# Patient Record
Sex: Female | Born: 1955 | Race: White | Hispanic: No | Marital: Married | Smoking: Never smoker
Health system: Southern US, Academic
[De-identification: ages and names within clinical notes are randomized; demographics above are authoritative.]

## PROBLEM LIST (undated history)

## (undated) DIAGNOSIS — I639 Cerebral infarction, unspecified: Secondary | ICD-10-CM

## (undated) HISTORY — PX: NO PAST SURGERIES: SHX2092

---

## 2007-11-28 ENCOUNTER — Emergency Department: Payer: Self-pay | Admitting: Emergency Medicine

## 2007-12-01 ENCOUNTER — Ambulatory Visit: Payer: Self-pay | Admitting: Emergency Medicine

## 2010-02-02 ENCOUNTER — Ambulatory Visit: Payer: Self-pay | Admitting: Internal Medicine

## 2010-07-16 ENCOUNTER — Emergency Department: Payer: Self-pay | Admitting: Emergency Medicine

## 2010-07-31 ENCOUNTER — Ambulatory Visit: Payer: Self-pay | Admitting: Physician Assistant

## 2013-02-26 ENCOUNTER — Emergency Department: Payer: Self-pay | Admitting: Emergency Medicine

## 2013-02-26 LAB — CBC
HCT: 37.8 % (ref 35.0–47.0)
HGB: 12.7 g/dL (ref 12.0–16.0)
MCH: 30.7 pg (ref 26.0–34.0)
MCHC: 33.6 g/dL (ref 32.0–36.0)
MCV: 91 fL (ref 80–100)
Platelet: 217 10*3/uL (ref 150–440)
RBC: 4.13 10*6/uL (ref 3.80–5.20)
RDW: 13.3 % (ref 11.5–14.5)
WBC: 5.2 10*3/uL (ref 3.6–11.0)

## 2013-02-26 LAB — COMPREHENSIVE METABOLIC PANEL
ANION GAP: 6 — AB (ref 7–16)
AST: 31 U/L (ref 15–37)
Albumin: 3.3 g/dL — ABNORMAL LOW (ref 3.4–5.0)
Alkaline Phosphatase: 53 U/L
BUN: 14 mg/dL (ref 7–18)
Bilirubin,Total: 0.4 mg/dL (ref 0.2–1.0)
CHLORIDE: 108 mmol/L — AB (ref 98–107)
CO2: 24 mmol/L (ref 21–32)
CREATININE: 0.63 mg/dL (ref 0.60–1.30)
Calcium, Total: 8.3 mg/dL — ABNORMAL LOW (ref 8.5–10.1)
EGFR (African American): >60
GLUCOSE: 84 mg/dL (ref 65–99)
OSMOLALITY: 275 (ref 275–301)
POTASSIUM: 3.9 mmol/L (ref 3.5–5.1)
SGPT (ALT): 24 U/L (ref 12–78)
Sodium: 138 mmol/L (ref 136–145)
Total Protein: 6.6 g/dL (ref 6.4–8.2)

## 2013-02-26 LAB — TROPONIN I

## 2013-08-03 ENCOUNTER — Ambulatory Visit: Payer: Self-pay | Admitting: Physician Assistant

## 2013-09-03 ENCOUNTER — Ambulatory Visit: Payer: Self-pay | Admitting: Physician Assistant

## 2014-01-25 ENCOUNTER — Ambulatory Visit: Payer: Self-pay | Admitting: Unknown Physician Specialty

## 2014-05-26 ENCOUNTER — Encounter: Payer: Self-pay | Admitting: Family Medicine

## 2014-05-26 DIAGNOSIS — R490 Dysphonia: Secondary | ICD-10-CM | POA: Insufficient documentation

## 2014-05-26 DIAGNOSIS — M19079 Primary osteoarthritis, unspecified ankle and foot: Secondary | ICD-10-CM | POA: Insufficient documentation

## 2014-05-26 DIAGNOSIS — M778 Other enthesopathies, not elsewhere classified: Secondary | ICD-10-CM | POA: Insufficient documentation

## 2014-05-26 DIAGNOSIS — Z8673 Personal history of transient ischemic attack (TIA), and cerebral infarction without residual deficits: Secondary | ICD-10-CM | POA: Insufficient documentation

## 2014-06-17 ENCOUNTER — Ambulatory Visit
Admission: EM | Admit: 2014-06-17 | Discharge: 2014-06-17 | Disposition: A | Discharge status: Home / Self Care | Payer: Federal, State, Local not specified - PPO | Attending: Registered Nurse | Admitting: Registered Nurse

## 2014-06-17 ENCOUNTER — Encounter: Payer: Self-pay | Admitting: Emergency Medicine

## 2014-06-17 DIAGNOSIS — J069 Acute upper respiratory infection, unspecified: Secondary | ICD-10-CM | POA: Diagnosis not present

## 2014-06-17 DIAGNOSIS — S29012A Strain of muscle and tendon of back wall of thorax, initial encounter: Secondary | ICD-10-CM | POA: Diagnosis not present

## 2014-06-17 HISTORY — DX: Cerebral infarction, unspecified: I63.9

## 2014-06-17 MED ORDER — IBUPROFEN 800 MG PO TABS: 800.0000 mg | ORAL_TABLET | Freq: Three times a day (TID) | ORAL | Status: DC | PRN

## 2014-06-17 MED ORDER — PSEUDOEPHEDRINE HCL 30 MG PO TABS: 30.0000 mg | ORAL_TABLET | ORAL | Status: DC | PRN

## 2014-06-17 MED ORDER — CYCLOBENZAPRINE HCL 5 MG PO TABS: 5.0000 mg | ORAL_TABLET | Freq: Three times a day (TID) | ORAL | Status: DC | PRN

## 2014-06-17 NOTE — ED Provider Notes (Signed)
CSN: [id number]     Arrival date & time 06/17/14  1126 History   None    Chief Complaint  Patient presents with  . Back Pain  . 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  Has noticed congestion, nonproductive cough, forehead headache, sore throat, menopause hot flashes continue, stomach feels tight  Patient is a 59 y.o. female presenting with back pain and abdominal pain. The history is provided by the patient.  Back Pain Location:  Thoracic spine Quality:  Aching Radiates to:  Does not radiate Pain severity:  Moderate Pain is:  Worse during the night Onset quality:  Gradual Duration:  1 week Timing:  Constant Progression:  Unchanged Chronicity:  New Context: lifting heavy objects and recent illness   Context: not emotional stress, not falling, not jumping from heights, not MCA, not MVA, not occupational injury, not pedestrian accident, not physical stress, not recent injury and not twisting   Relieved by:  Heating pad Worsened by:  Coughing and lying down Ineffective treatments:  Lying down and ibuprofen Associated symptoms: abdominal pain and headaches   Associated symptoms: no abdominal swelling, no bladder incontinence, no bowel incontinence, no chest pain, no dysuria, no fever, no leg pain, no numbness, no paresthesias, no tingling and no weakness   Abdominal Pain Associated symptoms: cough and sore throat   Associated symptoms: no chest pain, no chills, no constipation, no diarrhea, no dysuria, no fatigue, no fever, no nausea and no shortness of breath     Past Medical History  Diagnosis Date  . Stroke     3 years ago (mini stroke)   History reviewed. No pertinent  past surgical history. Family History  Problem Relation Age of Onset  . Hyperlipidemia Mother   . Heart attack Father    History  Substance Use Topics  . Smoking status: Former Games developer  . Smokeless tobacco: Never Used  . Alcohol Use: No   OB History    No data available     Review of Systems  Constitutional: Negative for fever, chills, diaphoresis, activity change, appetite change and fatigue.  HENT: Positive for congestion, rhinorrhea and sore throat. Negative for dental problem, drooling, ear discharge, ear pain, facial swelling, hearing loss, mouth sores, nosebleeds, postnasal drip, sinus pressure, sneezing, tinnitus, trouble swallowing and voice change.   Eyes: Negative for photophobia, pain, discharge, redness and itching.  Respiratory: Positive for cough. Negative for shortness of breath, wheezing and stridor.   Cardiovascular: Negative for chest pain.  Gastrointestinal: Positive for abdominal pain. Negative for nausea, diarrhea, constipation, blood in stool and bowel incontinence.  Genitourinary: Negative for bladder incontinence, dysuria, difficulty urinating and menstrual problem.  Musculoskeletal: Positive for myalgias and back pain. Negative for joint swelling, arthralgias, gait problem, neck pain and neck stiffness.  Skin: Negative for color change, pallor, rash and wound.  Allergic/Immunologic: Negative for environmental allergies and food allergies.  Neurological: Positive for headaches. Negative for dizziness, tingling, tremors, syncope, weakness, numbness and paresthesias.  Hematological: Negative for adenopathy. Does not bruise/bleed easily.  Psychiatric/Behavioral: Positive for sleep disturbance.    Allergies  Review of patient's allergies indicates no known allergies.  Home Medications   Prior to Admission medications   Medication Sig Start Date End Date Taking? Authorizing Provider  aspirin 325 MG EC tablet Take 1 tablet by mouth daily.    Historical  Provider, MD  cyclobenzaprine (FLEXERIL) 5 MG tablet Take 1 tablet (5 mg total) by mouth 3 (three) times daily as needed for muscle spasms. 06/17/14   Barbaraann Barthelina A Cedricka Sackrider, NP  ibuprofen (ADVIL,MOTRIN) 800 MG tablet Take 1 tablet (800 mg total) by mouth every 8 (eight) hours as needed for moderate pain. 06/17/14   Barbaraann Barthelina A Azelea Seguin, NP  pseudoephedrine (SUDAFED) 30 MG tablet Take 1 tablet (30 mg total) by mouth every 4 (four) hours as needed for congestion. 06/17/14   Jarold Songina A Ryan Palermo, NP   BP 123/82 mmHg  Pulse 71  Temp(Src) 98.3 F (36.8 C) (Tympanic)  Resp 16  Ht 5\' 4"  (1.626 m)  Wt 134 lb (60.782 kg)  BMI 22.99 kg/m2  SpO2 98% Physical Exam  Constitutional: She is oriented to person, place, and time. Vital signs are normal. She appears well-developed and well-nourished.  HENT:  Head: Normocephalic and atraumatic.  Right Ear: External ear normal.  Left Ear: External ear normal.  Nose: Nose normal.  Mouth/Throat: Oropharynx is clear and moist. No oropharyngeal exudate.  Erythema posterior pharynx; bilateral TMs with air fluid level clear  Eyes: Conjunctivae, EOM and lids are normal. Pupils are equal, round, and reactive to light. Right eye exhibits no discharge. Left eye exhibits no discharge. No scleral icterus.  Neck: Trachea normal and normal range of motion. Neck supple. No JVD present. No tracheal deviation present. No thyromegaly present.  Cardiovascular: Normal rate, regular rhythm, normal heart sounds and intact distal pulses.  Exam reveals no gallop and no friction rub.   No murmur heard. Pulmonary/Chest: Effort normal and breath sounds normal. No respiratory distress. She has no wheezes. She has no rales. She exhibits no tenderness.  Abdominal: Soft. She exhibits no distension. There is no tenderness.  Musculoskeletal: Normal range of motion. She exhibits no edema.       Thoracic back: She exhibits tenderness and pain. She exhibits normal range of motion, no bony tenderness, no  swelling, no edema, no deformity, no laceration, no spasm and normal pulse.       Back:  Full AROM shoulder, neck, back does not worsen pain  Lymphadenopathy:    She has no cervical adenopathy.  Neurological: She is alert and oriented to person, place, and time.  Skin: Skin is warm, dry and intact. No rash noted. No erythema. No pallor.  Psychiatric: She has a normal mood and affect. Her speech is normal and behavior is normal. Judgment and thought content normal. Cognition and memory are normal.  Nursing note and vitals reviewed.   ED Course  Procedures (including critical care time) Labs Review Labs Reviewed - No data to display  Imaging Review No results found. For acute pain, rest, and intermittent application of heat (do not sleep on heating pad).  I discussed longer-term treatment plan of PRN PO NSAIDS and I discussed a home back care exercise program with a strengthening and flexibility exercise.  Patient given Exitcare handout on upper back strain.  Patient reported she does not need to perform heavy lifting at work and will have coworker assist did not want restrictions letter today.  Trial flexeril 5mg  po TID prn discussed no alcohol or driving x8 hours after taking first dose until effects known for her as last took over 20 years ago.  Proper avoidance of heavy lifting discussed.  Consider physical therapy or chiropractic care and radiology if not improving.  Call or return to clinic as needed if these symptoms worsen or fail to improve as anticipated especially leg weakness, loss of bowel/bladder control or saddle paresthesias.   Viral upper respiratory infection: no evidence of invasive bacterial infection, non toxic and well hydrated.  This is most likely self limiting viral infection.  I do not see where any further testing or imaging is necessary at this time.   I will suggest supportive care, rest, good hygiene and encourage the patient to take adequate fluids.  The patient is to  return to clinic or EMERGENCY ROOM if symptoms worsen or change significantly e.g. fever, lethargy, SOB, wheezing.  Exitcare handout on common cold given to patient.  Patient verbalized agreement and understanding of treatment plan.   P2:  Hand washing and cover cough, injury prevention, fitness   MDM   1. Upper back strain, initial encounter   2. URI, acute        Barbaraann Barthelina A Tailor Lucking, NP 06/17/14 1227

## 2014-06-17 NOTE — ED Notes (Signed)
Patient c/o upper back pain and stomach discomfort for about a week.  Patient denies N/V/D.  Patient states that her pain is improved after eating.  Patient denies fevers.

## 2014-06-17 NOTE — Discharge Instructions (Signed)
Back Exercises These exercises may help you when beginning to rehabilitate your injury. Your symptoms may resolve with or without further involvement from your physician, physical therapist or athletic trainer. While completing these exercises, remember:   Restoring tissue flexibility helps normal motion to return to the joints. This allows healthier, less painful movement and activity.  An effective stretch should be held for at least 30 seconds.  A stretch should never be painful. You should only feel a gentle lengthening or release in the stretched tissue. STRETCH - Extension, Prone on Elbows   Lie on your stomach on the floor, a bed will be too soft. Place your palms about shoulder width apart and at the height of your head.  Place your elbows under your shoulders. If this is too painful, stack pillows under your chest.  Allow your body to relax so that your hips drop lower and make contact more completely with the floor.  Hold this position for __________ seconds.  Slowly return to lying flat on the floor. Repeat __________ times. Complete this exercise __________ times per day.  RANGE OF MOTION - Extension, Prone Press Ups   Lie on your stomach on the floor, a bed will be too soft. Place your palms about shoulder width apart and at the height of your head.  Keeping your back as relaxed as possible, slowly straighten your elbows while keeping your hips on the floor. You may adjust the placement of your hands to maximize your comfort. As you gain motion, your hands will come more underneath your shoulders.  Hold this position __________ seconds.  Slowly return to lying flat on the floor. Repeat __________ times. Complete this exercise __________ times per day.  RANGE OF MOTION- Quadruped, Neutral Spine   Assume a hands and knees position on a firm surface. Keep your hands under your shoulders and your knees under your hips. You may place padding under your knees for  comfort.  Drop your head and point your tail bone toward the ground below you. This will round out your low back like an angry cat. Hold this position for __________ seconds.  Slowly lift your head and release your tail bone so that your back sags into a large arch, like an old horse.  Hold this position for __________ seconds.  Repeat this until you feel limber in your low back.  Now, find your "sweet spot." This will be the most comfortable position somewhere between the two previous positions. This is your neutral spine. Once you have found this position, tense your stomach muscles to support your low back.  Hold this position for __________ seconds. Repeat __________ times. Complete this exercise __________ times per day.  STRETCH - Flexion, Single Knee to Chest   Lie on a firm bed or floor with both legs extended in front of you.  Keeping one leg in contact with the floor, bring your opposite knee to your chest. Hold your leg in place by either grabbing behind your thigh or at your knee.  Pull until you feel a gentle stretch in your low back. Hold __________ seconds.  Slowly release your grasp and repeat the exercise with the opposite side. Repeat __________ times. Complete this exercise __________ times per day.  STRETCH - Hamstrings, Standing  Stand or sit and extend your right / left leg, placing your foot on a chair or foot stool  Keeping a slight arch in your low back and your hips straight forward.  Lead with your chest and  lean forward at the waist until you feel a gentle stretch in the back of your right / left knee or thigh. (When done correctly, this exercise requires leaning only a small distance.)  Hold this position for __________ seconds. Repeat __________ times. Complete this stretch __________ times per day. STRENGTHENING - Deep Abdominals, Pelvic Tilt   Lie on a firm bed or floor. Keeping your legs in front of you, bend your knees so they are both pointed  toward the ceiling and your feet are flat on the floor.  Tense your lower abdominal muscles to press your low back into the floor. This motion will rotate your pelvis so that your tail bone is scooping upwards rather than pointing at your feet or into the floor.  With a gentle tension and even breathing, hold this position for __________ seconds. Repeat __________ times. Complete this exercise __________ times per day.  STRENGTHENING - Abdominals, Crunches   Lie on a firm bed or floor. Keeping your legs in front of you, bend your knees so they are both pointed toward the ceiling and your feet are flat on the floor. Cross your arms over your chest.  Slightly tip your chin down without bending your neck.  Tense your abdominals and slowly lift your trunk high enough to just clear your shoulder blades. Lifting higher can put excessive stress on the low back and does not further strengthen your abdominal muscles.  Control your return to the starting position. Repeat __________ times. Complete this exercise __________ times per day.  STRENGTHENING - Quadruped, Opposite UE/LE Lift   Assume a hands and knees position on a firm surface. Keep your hands under your shoulders and your knees under your hips. You may place padding under your knees for comfort.  Find your neutral spine and gently tense your abdominal muscles so that you can maintain this position. Your shoulders and hips should form a rectangle that is parallel with the floor and is not twisted.  Keeping your trunk steady, lift your right hand no higher than your shoulder and then your left leg no higher than your hip. Make sure you are not holding your breath. Hold this position __________ seconds.  Continuing to keep your abdominal muscles tense and your back steady, slowly return to your starting position. Repeat with the opposite arm and leg. Repeat __________ times. Complete this exercise __________ times per day. Document Released:  02/08/2005 Document Revised: 04/15/2011 Document Reviewed: 05/05/2008 [facility] Patient Information 2015 [place], [place]. This information is not intended to replace advice given to you by your health care provider. Make sure you discuss any questions you have with your health care provider. Upper Respiratory Infection, Adult An upper respiratory infection (URI) is also sometimes known as the common cold. The upper respiratory tract includes the nose, sinuses, throat, trachea, and bronchi. Bronchi are the airways leading to the lungs. Most people improve within 1 week, but symptoms can last up to 2 weeks. A residual cough may last even longer.  CAUSES Many different viruses can infect the tissues lining the upper respiratory tract. The tissues become irritated and inflamed and often become very moist. Mucus production is also common. A cold is contagious. You can easily spread the virus to others by oral contact. This includes kissing, sharing a glass, coughing, or sneezing. Touching your mouth or nose and then touching a surface, which is then touched by another person, can also spread the virus. SYMPTOMS  Symptoms typically develop 1 to 3 days after  you come in contact with a cold virus. Symptoms vary from person to person. They may include:  Runny nose.  Sneezing.  Nasal congestion.  Sinus irritation.  Sore throat.  Loss of voice (laryngitis).  Cough.  Fatigue.  Muscle aches.  Loss of appetite.  Headache.  Low-grade fever. DIAGNOSIS  You might diagnose your own cold based on familiar symptoms, since most people get a cold 2 to 3 times a year. Your caregiver can confirm this based on your exam. Most importantly, your caregiver can check that your symptoms are not due to another disease such as strep throat, sinusitis, pneumonia, asthma, or epiglottitis. Blood tests, throat tests, and X-rays are not necessary to diagnose a common cold, but they may sometimes be helpful in excluding  other more serious diseases. Your caregiver will decide if any further tests are required. RISKS AND COMPLICATIONS  You may be at risk for a more severe case of the common cold if you smoke cigarettes, have chronic heart disease (such as heart failure) or lung disease (such as asthma), or if you have a weakened immune system. The very young and very old are also at risk for more serious infections. Bacterial sinusitis, middle ear infections, and bacterial pneumonia can complicate the common cold. The common cold can worsen asthma and chronic obstructive pulmonary disease (COPD). Sometimes, these complications can require emergency medical care and may be life-threatening. PREVENTION  The best way to protect against getting a cold is to practice good hygiene. Avoid oral or hand contact with people with cold symptoms. Wash your hands often if contact occurs. There is no clear evidence that vitamin C, vitamin E, echinacea, or exercise reduces the chance of developing a cold. However, it is always recommended to get plenty of rest and practice good nutrition. TREATMENT  Treatment is directed at relieving symptoms. There is no cure. Antibiotics are not effective, because the infection is caused by a virus, not by bacteria. Treatment may include:  Increased fluid intake. Sports drinks offer valuable electrolytes, sugars, and fluids.  Breathing heated mist or steam (vaporizer or shower).  Eating chicken soup or other clear broths, and maintaining good nutrition.  Getting plenty of rest.  Using gargles or lozenges for comfort.  Controlling fevers with ibuprofen or acetaminophen as directed by your caregiver.  Increasing usage of your inhaler if you have asthma. Zinc gel and zinc lozenges, taken in the first 24 hours of the common cold, can shorten the duration and lessen the severity of symptoms. Pain medicines may help with fever, muscle aches, and throat pain. A variety of non-prescription medicines  are available to treat congestion and runny nose. Your caregiver can make recommendations and may suggest nasal or lung inhalers for other symptoms.  HOME CARE INSTRUCTIONS   Only take over-the-counter or prescription medicines for pain, discomfort, or fever as directed by your caregiver.  Use a warm mist humidifier or inhale steam from a shower to increase air moisture. This may keep secretions moist and make it easier to breathe.  Drink enough water and fluids to keep your urine clear or pale yellow.  Rest as needed.  Return to work when your temperature has returned to normal or as your caregiver advises. You may need to stay home longer to avoid infecting others. You can also use a face mask and careful hand washing to prevent spread of the virus. SEEK MEDICAL CARE IF:   After the first few days, you feel you are getting worse rather than  better.  You need your caregiver's advice about medicines to control symptoms.  You develop chills, worsening shortness of breath, or brown or red sputum. These may be signs of pneumonia.  You develop yellow or brown nasal discharge or pain in the face, especially when you bend forward. These may be signs of sinusitis.  You develop a fever, swollen neck glands, pain with swallowing, or white areas in the back of your throat. These may be signs of strep throat. SEEK IMMEDIATE MEDICAL CARE IF:   You have a fever.  You develop severe or persistent headache, ear pain, sinus pain, or chest pain.  You develop wheezing, a prolonged cough, cough up blood, or have a change in your usual mucus (if you have chronic lung disease).  You develop sore muscles or a stiff neck. Document Released: 07/17/2000 Document Revised: 04/15/2011 Document Reviewed: 04/28/2013 [facility] Patient Information 2015 [place]. This information is not intended to replace advice given to you by your health care provider. Make sure you discuss any questions you have with your  health care provider. Mid-Back Strain with Rehab  A strain is an injury in which a tendon or muscle is torn. The muscles and tendons of the mid-back are vulnerable to strains. However, these muscles and tendons are very strong and require a great force to be injured. The muscles of the mid-back are responsible for stabilizing the spinal column, as well as spinal twisting (rotation). Strains are classified into three categories. Grade 1 strains cause pain, but the tendon is not lengthened. Grade 2 strains include a lengthened ligament, due to the ligament being stretched or partially ruptured. With grade 2 strains there is still function, although the function may be decreased. Grade 3 strains involve a complete tear of the tendon or muscle, and function is usually impaired. SYMPTOMS   Pain in the middle of the back.  Pain that may affect only one side, and is worse with movement.  Muscle spasms, and often swelling in the back.  Loss of strength of the back muscles.  Crackling sound (crepitation) when the muscles are touched. CAUSES  Mid-back strains occur when a force is placed on the muscles or tendons that is greater than they can handle. Common causes of injury include:  Ongoing overuse of the muscle-tendon units in the middle back, usually from incorrect body posture.  A single violent injury or force applied to the back. RISK INCREASES WITH:  Sports that involve twisting forces on the spine or a lot of bending at the waist (football, rugby, weightlifting, bowling, golf, tennis, speed skating, racquetball, swimming, running, gymnastics, diving).  Poor strength and flexibility.  Failure to warm up properly before activity.  Family history of low back pain or disk disorders.  Previous back injury or surgery (especially fusion). PREVENTION  Learn and use proper sports technique.  Warm up and stretch properly before activity.  Allow for adequate recovery between  workouts.  Maintain physical fitness:  Strength, flexibility, and endurance.  Cardiovascular fitness. PROGNOSIS  If treated properly, mid-back strains usually heal within 6 weeks. RELATED COMPLICATIONS   Frequently recurring symptoms, resulting in a chronic problem. Properly treating the problem the first time decreases frequency of recurrence.  Chronic inflammation, scarring, and partial muscle-tendon tear.  Delayed healing or resolution of symptoms, especially if activity is resumed too soon.  Prolonged disability. TREATMENT Treatment first involves the use of ice and medicine, to reduce pain and inflammation. As the pain begins to subside, you may begin strengthening  and stretching exercises to improve body posture and sport technique. These exercises may be performed at home or with a therapist. Severe injuries may require referral to a therapist for further evaluation and treatment, such as ultrasound. Corticosteroid injections may be given to help reduce inflammation. Biofeedback (watching monitors of your body processes) and psychotherapy may also be prescribed. Prolonged bed rest is felt to do more harm than good. Massage may help break the muscle spasms. Sometimes, an injection of cortisone, with or without local anesthetics, may be given to help relieve the pain and spasms. MEDICATION   If pain medicine is needed, nonsteroidal anti-inflammatory medicines (aspirin and ibuprofen), or other minor pain relievers (acetaminophen), are often advised.  Do not take pain medicine for 7 days before surgery.  Prescription pain relievers may be given, if your caregiver thinks they are needed. Use only as directed and only as much as you need.  Ointments applied to the skin may be helpful.  Corticosteroid injections may be given by your caregiver. These injections should be reserved for the most serious cases, because they may only be given a certain number of times. HEAT AND COLD:    Cold treatment (icing) should be applied for 10 to 15 minutes every 2 to 3 hours for inflammation and pain, and immediately after activity that aggravates your symptoms. Use ice packs or an ice massage.  Heat treatment may be used before performing stretching and strengthening activities prescribed by your caregiver, physical therapist, or athletic trainer. Use a heat pack or a warm water soak. SEEK IMMEDIATE MEDICAL CARE IF:  Symptoms get worse or do not improve in 2 to 4 weeks, despite treatment.  You develop numbness, weakness, or loss of bowel or bladder function.  New, unexplained symptoms develop. (Drugs used in treatment may produce side effects.) EXERCISES RANGE OF MOTION (ROM) AND STRETCHING EXERCISES - Mid-Back Strain These exercises may help you when beginning to rehabilitate your injury. In order to successfully resolve your symptoms, you must improve your posture. These exercises are designed to help reduce the forward-head and rounded-shoulder posture which contributes to this condition. Your symptoms may resolve with or without further involvement from your physician, physical therapist or athletic trainer. While completing these exercises, remember:   Restoring tissue flexibility helps normal motion to return to the joints. This allows healthier, less painful movement and activity.  An effective stretch should be held for at least 30 seconds.  A stretch should never be painful. You should only feel a gentle lengthening or release in the stretched tissue. STRETCH - Axial Extension  Stand or sit on a firm surface. Assume a good posture: chest up, shoulders drawn back, stomach muscles slightly tense, knees unlocked (if standing) and feet hip width apart.  Slowly retract your chin, so your head slides back and your chin slightly lowers. Continue to look straight ahead.  You should feel a gentle stretch in the back of your head. Be certain not to feel an aggressive stretch  since this can cause headaches later.  Hold for __________ seconds. Repeat __________ times. Complete this exercise __________ times per day. RANGE OF MOTION- Upper Thoracic Extension  Sit on a firm chair with a high back. Assume a good posture: chest up, shoulders drawn back, abdominal muscles slightly tense, and feet hip width apart. Place a small pillow or folded towel in the curve of your lower back, if you are having difficulty maintaining good posture.  Gently brace your neck with your hands, allowing your  arms to rest on your chest.  Continue to support your neck and slowly extend your back over the chair. You will feel a stretch across your upper back.  Hold __________ seconds. Slowly return to the starting position. Repeat __________ times. Complete this exercise __________ times per day. RANGE OF MOTION- Mid-Thoracic Extension  Roll a towel so that it is about 4 inches in diameter.  Position the towel lengthwise. Lay on the towel so that your spine, but not your shoulder blades, are supported.  You should feel your mid-back arching toward the floor. To increase the stretch, extend your arms away from your body.  Hold for __________ seconds. Repeat exercise __________ times, __________ times per day. STRENGTHENING EXERCISES - Mid-Back Strain These exercises may help you when beginning to rehabilitate your injury. They may resolve your symptoms with or without further involvement from your physician, physical therapist or athletic trainer. While completing these exercises, remember:   Muscles can gain both the endurance and the strength needed for everyday activities through controlled exercises.  Complete these exercises as instructed by your physician, physical therapist or athletic trainer. Increase the resistance and repetitions only as guided by your caregiver.  You may experience muscle soreness or fatigue, but the pain or discomfort you are trying to eliminate should  never worsen during these exercises. If this pain does worsen, stop and make certain you are following the directions exactly. If the pain is still present after adjustments, discontinue the exercise until you can discuss the trouble with your caregiver. STRENGTHENING - Quadruped, Opposite UE/LE Lift  Assume a hands and knees position on a firm surface. Keep your hands under your shoulders and your knees under your hips. You may place padding under your knees for comfort.  Find your neutral spine and gently tense your abdominal muscles so that you can maintain this position. Your shoulders and hips should form a rectangle that is parallel with the floor and is not twisted.  Keeping your trunk steady, lift your right hand no higher than your shoulder and then your left leg no higher than your hip. Make sure you are not holding your breath. Hold this position __________ seconds.  Continuing to keep your abdominal muscles tense and your back steady, slowly return to your starting position. Repeat with the opposite arm and leg. Repeat __________ times. Complete this exercise __________ times per day.  STRENGTH - Shoulder Extensors  Secure a rubber exercise band or tubing to a fixed object (table, pole) so that it is at the height of your shoulders when you are either standing, or sitting on a firm armless chair.  With a thumbs-up grip, grasp an end of the band in each hand. Straighten your elbows and lift your hands straight in front of you at shoulder height. Step back away from the secured end of band, until it becomes tense.  Squeezing your shoulder blades together, pull your hands down to the sides of your thighs. Do not allow your hands to go behind you.  Hold for __________ seconds. Slowly ease the tension on the band, as you reverse the directions and return to the starting position. Repeat __________ times. Complete this exercise __________ times per day.  STRENGTH - Horizontal  Abductors Choose one of the two positions to complete this exercise. Prone: lying on stomach:  Lie on your stomach on a firm surface so that your right / left arm overhangs the edge. Rest your forehead on your opposite forearm. With your palm facing  the floor and your elbow straight, hold a __________ weight in your hand.  Squeeze your right / left shoulder blade to your mid-back spine and then slowly raise your arm to the height of the bed.  Hold for __________ seconds. Slowly reverse the directions and return to the starting position, controlling the weight as you lower your arm. Repeat __________ times. Complete this exercise __________ times per day. Standing:   Secure a rubber exercise band or tubing, so that it is at the height of your shoulders when you are either standing, or sitting on a firm armless chair.  Grasp an end of the band in each hand and have your palms face each other. Straighten your elbows and lift your hands straight in front of you at shoulder height. Step back away from the secured end of band, until it becomes tense.  Squeeze your shoulder blades together. Keeping your elbows locked and your hands at shoulder height, spread your arms apart, forming a "T" shape with your body. Hold __________ seconds. Slowly ease the tension on the band, as you reverse the directions and return to the starting position. Repeat __________ times. Complete this exercise __________ times per day. STRENGTH - Scapular Retractors and External Rotators, Rowing  Secure a rubber exercise band or tubing, so that it is at the height of your shoulders when you are either standing, or sitting on a firm armless chair.  With a palm-down grip, grasp an end of the band in each hand. Straighten your elbows and lift your hands straight in front of you at shoulder height. Step back away from the secured end of band, until it becomes tense.  Step 1: Squeeze your shoulder blades together. Bending your  elbows, draw your hands to your chest as if you are rowing a boat. At the end of this motion, your hands and elbow should be at shoulder height and your elbows should be out to your sides.  Step 2: Rotate your shoulder to raise your hands above your head. Your forearms should be vertical and your upper arms should be horizontal.  Hold for __________ seconds. Slowly ease the tension on the band, as you reverse the directions and return to the starting position. Repeat __________ times. Complete this exercise __________ times per day.  POSTURE AND BODY MECHANICS CONSIDERATIONS - Mid-Back Strain Keeping correct posture when sitting, standing or completing your activities will reduce the stress put on different body tissues, allowing injured tissues a chance to heal and limiting painful experiences. The following are general guidelines for improved posture. Your physician or physical therapist will provide you with any instructions specific to your needs. While reading these guidelines, remember:  The exercises prescribed by your provider will help you have the flexibility and strength to maintain correct postures.  The correct posture provides the best environment for your joints to work. All of your joints have less wear and tear when properly supported by a spine with good posture. This means you will experience a healthier, less painful body.  Correct posture must be practiced with all of your activities, especially prolonged sitting and standing. Correct posture is as important when doing repetitive low-stress activities (typing) as it is when doing a single heavy-load activity (lifting). PROPER SITTING POSTURE In order to minimize stress and discomfort on your spine, you must sit with correct posture. Sitting with good posture should be effortless for a healthy body. Returning to good posture is a gradual process. Many people can work toward this  most comfortably by using various supports until they  have the flexibility and strength to maintain this posture on their own. When sitting with proper posture, your ears will fall over your shoulders and your shoulders will fall over your hips. You should use the back of the chair to support your upper back. Your lower back will be in a neutral position, just slightly arched. You may place a small pillow or folded towel at the base of your low back for  support.  When working at a desk, create an environment that supports good, upright posture. Without extra support, muscles fatigue and lead to excessive strain on joints and other tissues. Keep these recommendations in mind: CHAIR:  A chair should be able to slide under your desk when your back makes contact with the back of the chair. This allows you to work closely.  The chair's height should allow your eyes to be level with the upper part of your monitor and your hands to be slightly lower than your elbows. BODY POSITION  Your feet should make contact with the floor. If this is not possible, use a foot rest.  Keep your ears over your shoulders. This will reduce stress on your neck and lower back. INCORRECT SITTING POSTURES If you are feeling tired and unable to assume a healthy sitting posture, do not slouch or slump. This puts excessive strain on your back tissues, causing more damage and pain. Healthier options include:  Using more support, like a lumbar pillow.  Switching tasks to something that requires you to be upright or walking.  Talking a brief walk.  Lying down to rest in a neutral-spine position. CORRECT STANDING POSTURES Proper standing posture should be assumed with all daily activities, even if they only take a few moments, like when brushing your teeth. As in sitting, your ears should fall over your shoulders and your shoulders should fall over your hips. You should keep a slight tension in your abdominal muscles to brace your spine. Your tailbone should point down to the  ground, not behind your body, resulting in an over-extended swayback posture.  INCORRECT STANDING POSTURES Common incorrect standing postures include a forward head, locked knees, and an excessive swayback. WALKING Walk with an upright posture. Your ears, shoulders and hips should all line-up. CORRECT LIFTING TECHNIQUES DO :   Assume a wide stance. This will provide you more stability and the opportunity to get as close as possible to the object which you are lifting.  Tense your abdominals to brace your spine. Bend at the knees and hips. Keeping your back locked in a neutral-spine position, lift using your leg muscles. Lift with your legs, keeping your back straight.  Test the weight of unknown objects before attempting to lift them.  Try to keep your elbows locked down at your sides in order get the best strength from your shoulders when carrying an object.  Always ask for help when lifting heavy or awkward objects. INCORRECT LIFTING TECHNIQUES DO NOT:   Lock your knees when lifting, even if it is a small object.  Bend and twist. Pivot at your feet or move your feet when needing to change directions.  Assume that you can safely pick up even a paperclip without proper posture. Document Released: 01/21/2005 Document Revised: 06/07/2013 Document Reviewed: 05/05/2008 [facility] Patient Information 2015 [place], [place]. This information is not intended to replace advice given to you by your health care provider. Make sure you discuss any questions you have with your  health care provider. ° °

## 2014-06-19 ENCOUNTER — Encounter: Payer: Self-pay | Admitting: *Deleted

## 2014-06-19 DIAGNOSIS — Z87891 Personal history of nicotine dependence: Secondary | ICD-10-CM | POA: Insufficient documentation

## 2014-06-19 DIAGNOSIS — B9789 Other viral agents as the cause of diseases classified elsewhere: Secondary | ICD-10-CM | POA: Diagnosis not present

## 2014-06-19 DIAGNOSIS — Z7982 Long term (current) use of aspirin: Secondary | ICD-10-CM | POA: Insufficient documentation

## 2014-06-19 DIAGNOSIS — J208 Acute bronchitis due to other specified organisms: Secondary | ICD-10-CM | POA: Diagnosis not present

## 2014-06-19 DIAGNOSIS — R0602 Shortness of breath: Secondary | ICD-10-CM | POA: Diagnosis present

## 2014-06-19 MED ORDER — ACETAMINOPHEN 325 MG PO TABS
650.0000 mg | ORAL_TABLET | Freq: Once | ORAL | Status: AC
  Administered 2014-06-19: 650 mg via ORAL

## 2014-06-19 MED ORDER — ONDANSETRON HCL 4 MG PO TABS: 8.0000 mg | ORAL_TABLET | Freq: Once | ORAL | Status: DC

## 2014-06-19 MED ORDER — ACETAMINOPHEN 325 MG PO TABS
ORAL_TABLET | ORAL | Status: AC
  Administered 2014-06-19: 650 mg via ORAL
  Filled 2014-06-19: qty 2

## 2014-06-19 MED ORDER — ONDANSETRON 8 MG PO TBDP
ORAL_TABLET | ORAL | Status: AC
  Administered 2014-06-19: 8 mg
  Filled 2014-06-19: qty 1

## 2014-06-19 NOTE — ED Notes (Signed)
Pt states cough and URI sxs since past Monday, worsening today. Pt is dyspneic @ rest, restless, persistent back pain, cough and chills. Pt is febrile and slightly tachycardiac in triage.

## 2014-06-20 ENCOUNTER — Encounter: Payer: Self-pay | Admitting: Emergency Medicine

## 2014-06-20 ENCOUNTER — Other Ambulatory Visit: Payer: Self-pay

## 2014-06-20 ENCOUNTER — Emergency Department: Payer: Federal, State, Local not specified - PPO

## 2014-06-20 ENCOUNTER — Emergency Department
Admission: EM | Admit: 2014-06-20 | Discharge: 2014-06-20 | Disposition: A | Discharge status: Home / Self Care | Payer: Federal, State, Local not specified - PPO | Attending: Student | Admitting: Student

## 2014-06-20 DIAGNOSIS — J208 Acute bronchitis due to other specified organisms: Secondary | ICD-10-CM

## 2014-06-20 LAB — COMPREHENSIVE METABOLIC PANEL
ALT: 39 U/L (ref 14–54)
ANION GAP: 7 (ref 5–15)
AST: 42 U/L — AB (ref 15–41)
Albumin: 4.8 g/dL (ref 3.5–5.0)
Alkaline Phosphatase: 64 U/L (ref 38–126)
BUN: 13 mg/dL (ref 6–20)
CALCIUM: 9.1 mg/dL (ref 8.9–10.3)
CO2: 29 mmol/L (ref 22–32)
Chloride: 102 mmol/L (ref 101–111)
Creatinine, Ser: 0.74 mg/dL (ref 0.44–1.00)
GFR calc Af Amer: >60 mL/min (ref >60)
Glucose, Bld: 118 mg/dL — ABNORMAL HIGH (ref 65–99)
Potassium: 4.3 mmol/L (ref 3.5–5.1)
Sodium: 138 mmol/L (ref 135–145)
Total Bilirubin: 0.5 mg/dL (ref 0.3–1.2)
Total Protein: 8.3 g/dL — ABNORMAL HIGH (ref 6.5–8.1)

## 2014-06-20 LAB — CBC
HCT: 44.7 % (ref 35.0–47.0)
HEMOGLOBIN: 14.9 g/dL (ref 12.0–16.0)
MCH: 30.3 pg (ref 26.0–34.0)
MCHC: 33.4 g/dL (ref 32.0–36.0)
MCV: 90.7 fL (ref 80.0–100.0)
PLATELETS: 165 10*3/uL (ref 150–440)
RBC: 4.93 MIL/uL (ref 3.80–5.20)
RDW: 13.8 % (ref 11.5–14.5)
WBC: 5.1 10*3/uL (ref 3.6–11.0)

## 2014-06-20 LAB — TROPONIN I: Troponin I: <0.03 ng/mL (ref <0.031)

## 2014-06-20 MED ORDER — PREDNISONE 10 MG PO TABS
ORAL_TABLET | ORAL | Status: AC
  Filled 2014-06-20: qty 3

## 2014-06-20 MED ORDER — ALBUTEROL SULFATE HFA 108 (90 BASE) MCG/ACT IN AERS: 2.0000 | INHALATION_SPRAY | Freq: Four times a day (QID) | RESPIRATORY_TRACT | Status: DC | PRN

## 2014-06-20 MED ORDER — SODIUM CHLORIDE 0.9 % IV BOLUS (SEPSIS)
500.0000 mL | Freq: Once | INTRAVENOUS | Status: AC
  Administered 2014-06-20: 500 mL via INTRAVENOUS

## 2014-06-20 MED ORDER — OXYCODONE HCL 5 MG PO TABS
ORAL_TABLET | ORAL | Status: AC
  Administered 2014-06-20: 5 mg via ORAL
  Filled 2014-06-20: qty 1

## 2014-06-20 MED ORDER — PREDNISONE 20 MG PO TABS
ORAL_TABLET | ORAL | Status: AC
  Administered 2014-06-20: 60 mg via ORAL
  Filled 2014-06-20: qty 3

## 2014-06-20 MED ORDER — OXYCODONE HCL 5 MG PO TABS
5.0000 mg | ORAL_TABLET | Freq: Once | ORAL | Status: AC
Start: 2014-06-20 — End: 2014-06-20
  Administered 2014-06-20: 5 mg via ORAL

## 2014-06-20 MED ORDER — PREDNISONE 20 MG PO TABS
60.0000 mg | ORAL_TABLET | Freq: Once | ORAL | Status: AC
  Administered 2014-06-20: 60 mg via ORAL

## 2014-06-20 MED ORDER — PREDNISONE 20 MG PO TABS: 60.0000 mg | ORAL_TABLET | Freq: Every day | ORAL | Status: AC

## 2014-06-20 NOTE — ED Notes (Signed)
Patient reports having cough, bodyaches, fever and chills for approx 1week.  Pt states she had back pain last week so went to urgent care and was given muscle relaxer and she tried it, but she didn't feel any better so she quit taking it.  Pt denies cough being productive.

## 2014-06-20 NOTE — Discharge Instructions (Signed)
Acute Bronchitis Bronchitis is inflammation of the airways that extend from the windpipe into the lungs (bronchi). The inflammation often causes mucus to develop. This leads to a cough, which is the most common symptom of bronchitis.  In acute bronchitis, the condition usually develops suddenly and goes away over time, usually in a couple weeks. Smoking, allergies, and asthma can make bronchitis worse. Repeated episodes of bronchitis may cause further lung problems.  CAUSES Acute bronchitis is most often caused by the same virus that causes a cold. The virus can spread from person to person (contagious) through coughing, sneezing, and touching contaminated objects. SIGNS AND SYMPTOMS   Cough.   Fever.   Coughing up mucus.   Body aches.   Chest congestion.   Chills.   Shortness of breath.   Sore throat.  DIAGNOSIS  Acute bronchitis is usually diagnosed through a physical exam. Your health care provider will also ask you questions about your medical history. Tests, such as chest X-rays, are sometimes done to rule out other conditions.  TREATMENT  Acute bronchitis usually goes away in a couple weeks. Oftentimes, no medical treatment is necessary. Medicines are sometimes given for relief of fever or cough. Antibiotic medicines are usually not needed but may be prescribed in certain situations. In some cases, an inhaler may be recommended to help reduce shortness of breath and control the cough. A cool mist vaporizer may also be used to help thin bronchial secretions and make it easier to clear the chest.  HOME CARE INSTRUCTIONS  Get plenty of rest.   Drink enough fluids to keep your urine clear or pale yellow (unless you have a medical condition that requires fluid restriction). Increasing fluids may help thin your respiratory secretions (sputum) and reduce chest congestion, and it will prevent dehydration.   Take medicines only as directed by your health care provider.  If  you were prescribed an antibiotic medicine, finish it all even if you start to feel better.  Avoid smoking and secondhand smoke. Exposure to cigarette smoke or irritating chemicals will make bronchitis worse. If you are a smoker, consider using nicotine gum or skin patches to help control withdrawal symptoms. Quitting smoking will help your lungs heal faster.   Reduce the chances of another bout of acute bronchitis by washing your hands frequently, avoiding people with cold symptoms, and trying not to touch your hands to your mouth, nose, or eyes.   Keep all follow-up visits as directed by your health care provider.  SEEK MEDICAL CARE IF: Your symptoms do not improve after 1 week of treatment.  SEEK IMMEDIATE MEDICAL CARE IF:  You develop an increased fever or chills.   You have chest pain.   You have severe shortness of breath.  You have bloody sputum.   You develop dehydration.  You faint or repeatedly feel like you are going to pass out.  You develop repeated vomiting.  You develop a severe headache. MAKE SURE YOU:   Understand these instructions.  Will watch your condition.  Will get help right away if you are not doing well or get worse. Document Released: 02/29/2004 Document Revised: 06/07/2013 Document Reviewed: 07/14/2012 [facility] Patient Information 2015 [place], [place]LLC. This information is not intended to replace advice given to you by your health care provider. Make sure you discuss any questions you have with your health care provider.  Acute Bronchitis Bronchitis is when the airways that extend from the windpipe into the lungs get red, puffy, and painful (inflamed). Bronchitis often  causes thick spit (mucus) to develop. This leads to a cough. A cough is the most common symptom of bronchitis. In acute bronchitis, the condition usually begins suddenly and goes away over time (usually in 2 weeks). Smoking, allergies, and asthma can make bronchitis worse. Repeated  episodes of bronchitis may cause more lung problems. HOME CARE  Rest.  Drink enough fluids to keep your pee (urine) clear or pale yellow (unless you need to limit fluids as told by your doctor).  Only take over-the-counter or prescription medicines as told by your doctor.  Avoid smoking and secondhand smoke. These can make bronchitis worse. If you are a smoker, think about using nicotine gum or skin patches. Quitting smoking will help your lungs heal faster.  Reduce the chance of getting bronchitis again by:  Washing your hands often.  Avoiding people with cold symptoms.  Trying not to touch your hands to your mouth, nose, or eyes.  Follow up with your doctor as told. GET HELP IF: Your symptoms do not improve after 1 week of treatment. Symptoms include:  Cough.  Fever.  Coughing up thick spit.  Body aches.  Chest congestion.  Chills.  Shortness of breath.  Sore throat. GET HELP RIGHT AWAY IF:   You have an increased fever.  You have chills.  You have severe shortness of breath.  You have bloody thick spit (sputum).  You throw up (vomit) often.  You lose too much body fluid (dehydration).  You have a severe headache.  You faint. MAKE SURE YOU:   Understand these instructions.  Will watch your condition.  Will get help right away if you are not doing well or get worse. Document Released: 07/10/2007 Document Revised: 09/23/2012 Document Reviewed: 07/14/2012 [facility]Exit[facility] Patient Information 2015 [place]ExitCare, [place]LLC. This information is not intended to replace advice given to you by your health care provider. Make sure you discuss any questions you have with your health care provider.

## 2014-06-20 NOTE — ED Provider Notes (Signed)
[facility] Provider Note  ____________________________________________  Time seen: Approximately 4:36 AM  I have reviewed the triage vital signs and the nursing notes.   HISTORY  Chief Complaint Shortness of Breath    HPI Kathy Kennedy is a 59 y.o. female with history of "a mini stroke" in the past visits for evaluation of one week of congestion, rhinorrhea, dry cough, chest pain associated with cough, generalized myalgias and fever. She was seen on 513/2016 at urgent care for myalgias with back pain and URI and discharged with symptomatic support. Her symptoms have persisted. She has mild shortness of breath, cough makes her chest pain worse. No vomiting or diarrhea. Onset gradual, timing is constant, current severity 10 out of 10.   Past Medical History  Diagnosis Date  . Stroke     3 years ago (mini stroke)    Patient Active Problem List   Diagnosis Date Noted  . H/O transient cerebral ischemia 05/26/2014  . Hoarse 05/26/2014  . 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 Tendinitis of wrist 05/26/2014    History reviewed. No pertinent past surgical history.  Current Outpatient Rx  Name  Route  Sig  Dispense  Refill  . aspirin 325 MG EC tablet   Oral   Take 1 tablet by mouth daily.         . cyclobenzaprine (FLEXERIL) 5 MG tablet   Oral   Take 1 tablet (5 mg total) by mouth 3 (three) times daily as needed for muscle spasms.   30 tablet   0   . ibuprofen (ADVIL,MOTRIN) 800 MG tablet   Oral   Take 1 tablet (800 mg total) by mouth every 8 (eight) hours as needed for moderate pain.   21 tablet   0   . pseudoephedrine (SUDAFED) 30 MG tablet   Oral   Take 1 tablet (30 mg total) by mouth every 4 (four) hours as needed for congestion.   30 tablet   0     Allergies Review of patient's allergies indicates no known allergies.  Family History  Problem Relation Age of Onset  . Hyperlipidemia Mother   .  Heart attack Father     Social History History  Substance Use Topics  . Smoking status: Former Games developermoker  . Smokeless tobacco: Never Used  . Alcohol Use: No    Review of Systems Constitutional: + fever/chills Eyes: No visual changes. ENT: No sore throat. Cardiovascular: + chest pain. Respiratory: + shortness of breath. Gastrointestinal: No abdominal pain.  No nausea, no vomiting.  No diarrhea.  No constipation. Genitourinary: Negative for dysuria. Musculoskeletal: Negative for back pain. Skin: Negative for rash. Neurological: Negative for headaches, focal weakness or numbness.  10-point ROS otherwise negative.  ____________________________________________   PHYSICAL EXAM:  VITAL SIGNS: ED Triage Vitals  Enc Vitals Group     BP 06/19/14 2341 139/81 mmHg     Pulse Rate 06/19/14 2341 102     Resp 06/19/14 2341 20     Temp 06/19/14 2341 101.7 F (38.7 C)     Temp Source 06/19/14 2341 Oral     SpO2 06/19/14 2341 97 %     Weight 06/19/14 2341 136 lb (61.689 kg)     Height 06/19/14 2341 5\' 4"  (1.626 m)     Head Cir --      Peak Flow --      Pain Score 06/19/14 2342 9     Pain Loc --  Pain Edu? --      Excl. in GC? --     Constitutional: Alert and oriented. Fatigued but nontoxic-appearing and in no acute distress. Eyes: Conjunctivae are normal. PERRL. EOMI. Head: Atraumatic. Nose: No congestion/rhinnorhea. Mouth/Throat: Mucous membranes are moist.  Oropharynx non-erythematous. Neck: No stridor. Supple without meningismus. Cardiovascular: mildly tachycardi rate, regular rhythm. Grossly normal heart sounds.  Good peripheral circulation. Respiratory: Normal respiratory effort.  No retractions. Lungs CTAB. Gastrointestinal: Soft and nontender. No distention. No abdominal bruits. No CVA tenderness. Genitourinary: Deferred Back: no midline tenderness. Musculoskeletal: No lower extremity tenderness nor edema.  No joint effusions. Neurologic:  Normal speech and  language. No gross focal neurologic deficits are appreciated. Speech is normal. No gait instability. Skin:  Skin is warm, dry and intact. No rash noted. Psychiatric: Mood and affect are normal. Speech and behavior are normal.  ____________________________________________   LABS (all labs ordered are listed, but only abnormal results are displayed)  Labs Reviewed  COMPREHENSIVE METABOLIC PANEL - Abnormal; Notable for the following:    Glucose, Bld 118 (*)    Total Protein 8.3 (*)    AST 42 (*)    All other components within normal limits  CBC  TROPONIN I   ____________________________________________  EKG  ED ECG REPORT   Date: 06/20/2014  EKG Time: 03:54  Rate: 74  Rhythm: normal sinus rhythm  Axis: Normal  Intervals:none  ST&T Change: No acute ST segment elevation, nonspecific T-wave abnormality but otherwise normal  ____________________________________________  RADIOLOGY   CXR IMPRESSION: No acute pulmonary process. ____________________________________________   PROCEDURES  Procedure(s) performed: None  Critical Care performed: No  ____________________________________________   INITIAL IMPRESSION / ASSESSMENT AND PLAN / ED COURSE  Pertinent labs & imaging results that were available during my care of the patient were reviewed by me and considered in my medical decision making (see chart for details).  Kathy Kennedy is a 59 y.o. female with history of "a mini stroke" in the past visits for evaluation of one week of congestion, rhinorrhea, dry cough, chest pain associated with cough, generalized myalgias and fever. Mildly tachycardic with fever on arrival. Lungs clear, no increased work of breathing, no hypoxia. Suspect possibly viral bronchitis with muscular skeletal chest pain. Troponin negative, not consistent with ACS. Doubt PE. We'll give IV fluids, discharge with albuterol and prednisone. She will follow-up with her primary  doctor.  ----------------------------------------- 6:12 AM on 06/20/2014 -----------------------------------------  At this time, tachycardia resolves, heart rate 84. The patient is no longer febrile. Patient feels much better. DC as above. ____________________________________________   FINAL CLINICAL IMPRESSION(S) / ED DIAGNOSES  Final diagnoses:  Acute bronchitis, viral      Gayla DossEryka A Casmer Yepiz, MD 06/20/14 [phone]

## 2014-06-21 ENCOUNTER — Telehealth: Payer: Self-pay | Admitting: Emergency Medicine

## 2014-06-21 NOTE — ED Notes (Signed)
Pt called and said she did not get work note.  Note written for 5/17 and 5/18 and placed at stat desk for pickup.

## 2014-07-12 ENCOUNTER — Ambulatory Visit: Payer: Federal, State, Local not specified - PPO | Attending: Unknown Physician Specialty | Admitting: Speech Pathology

## 2014-07-12 DIAGNOSIS — R490 Dysphonia: Secondary | ICD-10-CM | POA: Insufficient documentation

## 2014-07-13 ENCOUNTER — Encounter: Payer: Self-pay | Admitting: Speech Pathology

## 2014-07-13 NOTE — Therapy (Signed)
[facility] MAIN [facility] SERVICES [address] [place], [place], [address] Phone: [phone]   Fax:  [phone]  Speech Language Pathology Treatment  Patient Details  Name: Kathy Kennedy MRN: [id number] Date of Birth: 1955/09/24 Referring Provider:  Linus Salmons, MD  Encounter Date: 07/12/2014      End of Session - 07/13/14 1214    Visit Number 1   Number of Visits 17   Date for SLP Re-Evaluation 09/06/14   SLP Start Time 1615   SLP Stop Time  1659   SLP Time Calculation (min) 44 min   Activity Tolerance Patient tolerated treatment well      Past Medical History  Diagnosis Date   Stroke     3 years ago (mini stroke)    History reviewed. No pertinent past surgical history.  There were no vitals filed for this visit.  Visit Diagnosis: Dysphonia - Plan: SLP plan of care cert/re-cert      Subjective Assessment - 07/13/14 1148    Subjective The patient reports hoarseness for about a year.  She has been evaluated by Dr. Jenne Campus and has right vocal cord paralysis.  The patient would like to "speak normally" and avoid invasive interventions if possible.   Currently in Pain? No/denies           SLP Evaluation [facility] - 07/13/14 1148    SLP Visit Information   SLP Received On 07/12/14   Onset Date 07/05/2014   Medical Diagnosis Right vocal cord paralysis   Subjective   Subjective The patient reports hoarseness for about a year.  She has been evaluated by Dr. Jenne Campus and has right vocal cord paralysis.  The patient would like to "speak normally" and avoid invasive interventions if possible.   Oral Motor/Sensory Function   Overall Oral Motor/Sensory Function Appears within functional limits for tasks assessed   Motor Speech   Overall Motor Speech Impaired   Respiration Impaired   Level of Impairment Conversation   Phonation Hoarse   Resonance Within functional limits   Articulation Within functional limitis   Intelligibility  Intelligible   Motor Planning Witnin functional limits   Motor Speech Errors Not applicable   Phonation Impaired   Standardized Assessments   Standardized Assessments  Other Assessment  Perceptual Voice Evaluation      Perceptual Voice Evaluation Voice history: Right vocal cord paralysis  Patient Quality of Life Survey: Voice Handicap Index-10 Score of 23  A score of 10 or higher indicates perceived handicap  Maximum phonation time for sustained ah: 9 seconds  Average fundamental frequency during sustained ah: 256 Hz  Average time patient was able to sustain /s/: 11.7  Average time patient was able to sustain /z/: 12  s/z ratio : 0.98  Highest dynamic pitch when altering pitch from a low note to a high note: 474 Hz  Highest pitch during conversational speech: 387 Hz  Lowest dynamic pitch when altering from a high note to a low note: 144 Hz  Lowest pitch during conversational speech: 109 Hz  Visi-Pitch: Multi-Dimensional Voice Program (MDVP)  MDVP extracts objective quantitative values (Relative Average Perturbation, Shimmer, Voice Turbulence Index, and Noise to Harmonic Ratio) on sustained phonation, which are displayed graphically and numerically in comparison to a built-in normative database.  The patient exhibited values outside the norm for Relative Average Perturbation and Shimmer. The patient improved all parameters when cued to alter voicing (loud like me).         SLP Education -  07/13/14 1206    Education provided Yes   Education Details Educate RE: process of voice building program   Person(s) Educated Patient   Methods Explanation   Comprehension Verbalized understanding            SLP Long Term Goals - 07/13/14 1221    SLP LONG TERM GOAL #1   Title The patient will demonstrate independent understanding of vocal hygiene concepts and neck, shoulder, lingual stretching exercises.   Time 8   Period Weeks   Status New   SLP LONG TERM GOAL #2    Title The patient will be independent for abdominal breathing and breath support exercises.   Time 8   Period Weeks   Status New   SLP LONG TERM GOAL #3   Title The patient will maximize voice quality and loudness using breath support for sustained vowel production, pitch glides, and hierarchal speech drill.   Time 8   Period Weeks   Status New   SLP LONG TERM GOAL #4   Title The patient will maximize voice quality and loudness using breath support for paragraph length recitation with 80% accuracy.   Time 8   Period Weeks   Status New          Plan - 07/13/14 1217    Clinical Impression Statement This [age] year woman under the care of Dr. 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  The patient will benefit from voice therapy for education, to improve breath control/support for speech, and reduce laryngeal tension, and learn techniques to increase loudness and pitch range without strain.      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

## 2014-07-22 ENCOUNTER — Ambulatory Visit: Payer: Federal, State, Local not specified - PPO | Admitting: Speech Pathology

## 2014-07-22 ENCOUNTER — Encounter: Payer: Self-pay | Admitting: Speech Pathology

## 2014-07-22 DIAGNOSIS — R490 Dysphonia: Secondary | ICD-10-CM | POA: Diagnosis not present

## 2014-07-22 NOTE — Therapy (Signed)
[facility] MAIN [facility] SERVICES [address] [place], [place], [address] Phone: [phone]   Fax:  [phone]  Speech Language Pathology Treatment  Patient Details  Name: Kathy Kennedy MRN: [id number] Date of Birth: September 25, 1955 Referring Provider:  Linus Salmons, MD  Encounter Date: 07/22/2014      End of Session - 07/22/14 1222    Visit Number 2   Number of Visits 17   Date for SLP Re-Evaluation 09/06/14   SLP Start Time 0900   SLP Stop Time  0955   SLP Time Calculation (min) 55 min   Activity Tolerance Patient tolerated treatment well      Past Medical History  Diagnosis Date  . Stroke     3 years ago (mini stroke)    No past surgical history on file.  There were no vitals filed for this visit.  Visit Diagnosis: Dysphonia      Subjective Assessment - 07/22/14 1219    Subjective The patient reports she would like to pursue voice therapy to improve voice quality prior to exploring more invasive interventions.   Currently in Pain? No/denies               ADULT SLP TREATMENT - 07/22/14 1218    Treatment Provided   Treatment provided Cognitive-Linquistic   Pain Assessment   Pain Assessment No/denies pain   Cognitive-Linquistic Treatment   Treatment focused on Voice   Skilled Treatment The patient was provided with written and verbal teaching regarding neck and shoulder relaxation exercises to promote relaxed phonation. The patient was provided with written and verbal teaching regarding breath support exercises to maximize inhalation for strong breath support for speech.  Patient is independent for execution of exercises.  Patient achieved improved voice quality for sustained vowels with vocal loudness. Patient able to reproduce clear vocal quality 85% accuracy.    Assessment / Recommendations / Plan   Plan Continue with current plan of care   Progression Toward Goals   Progression toward goals Progressing toward  goals          SLP Education - 07/22/14 1221    Education provided Yes   Education Details Neck stretching, abdominal breathing, and vocal exercises   Person(s) Educated Patient   Methods Explanation   Comprehension Verbalized understanding;Returned demonstration            SLP Long Term Goals - 07/13/14 1221    SLP LONG TERM GOAL #1   Title The patient will demonstrate independent understanding of vocal hygiene concepts and neck, shoulder, lingual stretching exercises.   Time 8   Period Weeks   Status New   SLP LONG TERM GOAL #2   Title The patient will be independent for abdominal breathing and breath support exercises.   Time 8   Period Weeks   Status New   SLP LONG TERM GOAL #3   Title The patient will maximize voice quality and loudness using breath support for sustained vowel production, pitch glides, and hierarchal speech drill.   Time 8   Period Weeks   Status New   SLP LONG TERM GOAL #4   Title The patient will maximize voice quality and loudness using breath support for paragraph length recitation with 80% accuracy.   Time 8   Period Weeks   Status New          Plan - 07/22/14 1222    Clinical Impression Statement Patient demonstrates understanding of exercises and is beginning to use  abdominal support for improved vocal quality during sustained vowels. Skilled intervention and feedback on using vocal exercise to strengthen the muscles of phonation, required to help patient generalize techniques into speech conversational speech.    Speech Therapy Frequency 2x / week   Duration Other (comment)  8 weeks   Potential to Achieve Goals Good   Potential Considerations Ability to learn/carryover information;Cooperation/participation level;Previous level of function;Other (comment)   SLP Home Exercise Plan Stretching, abdominal breathing, and vocal exercises   Consulted and Agree with Plan of Care Patient        Problem List Patient Active Problem List    Diagnosis Date Noted  . H/O transient cerebral ischemia 05/26/2014  . Hoarse 05/26/2014  . 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 Tendinitis of wrist 05/26/2014    Havery Moros 07/22/2014, 12:24 PM  [facility] MAIN [facility] SERVICES [address] [place], [place], [address] Phone: [phone]   Fax:  [phone]

## 2014-07-25 ENCOUNTER — Encounter: Payer: Self-pay | Admitting: Speech Pathology

## 2014-07-25 ENCOUNTER — Ambulatory Visit: Payer: Federal, State, Local not specified - PPO | Admitting: Speech Pathology

## 2014-07-25 DIAGNOSIS — R490 Dysphonia: Secondary | ICD-10-CM

## 2014-07-25 NOTE — Therapy (Signed)
[facility] MAIN [facility] SERVICES [address] [place], [place], [address] Phone: [phone]   Fax:  [phone]  Speech Language Pathology Treatment  Patient Details  Name: Kathy Kennedy MRN: [id number] Date of Birth: Sep 04, 1955 Referring Provider:  Linus Salmons, MD  Encounter Date: 07/25/2014      End of Session - 07/25/14 1133    Visit Number 3   Number of Visits 17   Date for SLP Re-Evaluation 09/06/14   SLP Start Time 0900   SLP Stop Time  0957   SLP Time Calculation (min) 57 min   Activity Tolerance Patient tolerated treatment well      Past Medical History  Diagnosis Date  . Stroke     3 years ago (mini stroke)    No past surgical history on file.  There were no vitals filed for this visit.  Visit Diagnosis: Dysphonia      Subjective Assessment - 07/25/14 1132    Subjective Patient reports her family has commented on her improved voice while she practices her home exercises.   Currently in Pain? No/denies               ADULT SLP TREATMENT - 07/25/14 1129    Treatment Provided   Treatment provided Cognitive-Linquistic   Pain Assessment   Pain Assessment No/denies pain   Cognitive-Linquistic Treatment   Treatment focused on Voice   Skilled Treatment Patient used vocal loudness to achieve clear vocal quality with vowels and vowels plus initial consonants with 100% accuracy. Patient used loudness to produce words with a clear voice in 88% of trials. Patient read sentences of 8-10 words in length with clear voice using vocal loudness and breathing techniques in 82% of trials. Patient attempted to read longer sentences but demonstrated reduced ability to achieve loudness required to produce a clear voice, possibly a result of fatigue. We ended the session by focusing on producing clear voice through loudness in short conversation.   Assessment / Recommendations / Plan   Plan Continue with current plan of care   Progression Toward Goals   Progression toward goals Progressing toward goals          SLP Education - 07/25/14 1133    Education provided Yes   Education Details Vocal loudness   Person(s) Educated Patient   Methods Explanation   Comprehension Verbalized understanding;Returned demonstration            SLP Long Term Goals - 07/13/14 1221    SLP LONG TERM GOAL #1   Title The patient will demonstrate independent understanding of vocal hygiene concepts and neck, shoulder, lingual stretching exercises.   Time 8   Period Weeks   Status New   SLP LONG TERM GOAL #2   Title The patient will be independent for abdominal breathing and breath support exercises.   Time 8   Period Weeks   Status New   SLP LONG TERM GOAL #3   Title The patient will maximize voice quality and loudness using breath support for sustained vowel production, pitch glides, and hierarchal speech drill.   Time 8   Period Weeks   Status New   SLP LONG TERM GOAL #4   Title The patient will maximize voice quality and loudness using breath support for paragraph length recitation with 80% accuracy.   Time 8   Period Weeks   Status New          Plan - 07/25/14 1133    Clinical  Impression Statement Patient is making excellent progress in attaining clear vocal quality through loudness. She shows awareness of times when her vocal quality deteriorates and is able to make adjustments to correct quality. Skilled intervention is necessary to encourage patient to use the techniques she is learning to achieve clear vocal quality in conversational speech and as length of utterance expands.   Speech Therapy Frequency 2x / week   Duration Other (comment)  8 weeks   Treatment/Interventions Patient/family education;SLP instruction and feedback;Functional tasks   Potential to Achieve Goals Good   Potential Considerations Ability to learn/carryover information;Cooperation/participation level;Previous level of function;Other  (comment)   SLP Home Exercise Plan Stretching, abdominal breathing, and vocal exercises   Consulted and Agree with Plan of Care Patient        Problem List Patient Active Problem List   Diagnosis Date Noted  . H/O transient cerebral ischemia 05/26/2014  . Hoarse 05/26/2014  . 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 Tendinitis of wrist 05/26/2014    Havery Moros 07/25/2014, 11:34 AM  [facility] MAIN [facility] SERVICES [address] [place], [place], [address] Phone: [phone]   Fax:  [phone]

## 2014-07-26 ENCOUNTER — Ambulatory Visit
Admission: EM | Admit: 2014-07-26 | Discharge: 2014-07-26 | Disposition: A | Discharge status: Home / Self Care | Payer: Federal, State, Local not specified - PPO | Attending: Family Medicine | Admitting: Family Medicine

## 2014-07-26 DIAGNOSIS — L259 Unspecified contact dermatitis, unspecified cause: Secondary | ICD-10-CM | POA: Diagnosis not present

## 2014-07-26 MED ORDER — PREDNISONE 20 MG PO TABS: ORAL_TABLET | ORAL | Status: DC

## 2014-07-26 NOTE — ED Provider Notes (Signed)
CSN: [id number]     Arrival date & time 07/26/14  1844 History   First MD Initiated Contact with Patient 07/26/14 1919     Chief Complaint  Patient presents with  . Rash   (Consider location/radiation/quality/duration/timing/severity/associated sxs/prior Treatment) HPI Comments: 59 yo female with exposure/contact with poison ivy over the weekend while working in yard. Since then has developed itchy, red rash to forearms, legs and abdomen. Has tried over the counter medications without relief.   Patient is a 59 y.o. female presenting with rash. The history is provided by the patient.  Rash   Past Medical History  Diagnosis Date  . Stroke     3 years ago (mini stroke)   History reviewed. No pertinent past surgical history. Family History  Problem Relation Age of Onset  . Hyperlipidemia Mother   . Heart attack Father    History  Substance Use Topics  . Smoking status: Former Smoker -- 1.00 packs/day for 10 years    Quit date: 02/04/1984  . Smokeless tobacco: Never Used  . Alcohol Use: No   OB History    Gravida Para Term Preterm AB TAB SAB Ectopic Multiple Living       Review of Systems  Skin: Positive for rash.    Allergies  Molds & smuts  Home Medications   Prior to Admission medications   Medication Sig Start Date End Date Taking? Authorizing Provider  albuterol (PROVENTIL HFA;VENTOLIN HFA) 108 (90 BASE) MCG/ACT inhaler Inhale 2 puffs into the lungs every 6 (six) hours as needed for wheezing or shortness of breath (cough). 06/20/14   Gayla Doss, MD  aspirin 325 MG EC tablet Take 1 tablet by mouth daily.    Historical Provider, MD  cyclobenzaprine (FLEXERIL) 5 MG tablet Take 1 tablet (5 mg total) by mouth 3 (three) times daily as needed for muscle spasms. 06/17/14   Barbaraann Barthel, NP  ibuprofen (ADVIL,MOTRIN) 800 MG tablet Take 1 tablet (800 mg total) by mouth every 8 (eight) hours as needed for moderate pain. 06/17/14   Barbaraann Barthel, NP   predniSONE (DELTASONE) 20 MG tablet 3 tabs po qd for qd for 3 days, then 2 tabs po qd for 4 days, then 1 tab po qd for 4 days, then half tab po qd for 3 days 07/26/14   Payton Mccallum, MD  pseudoephedrine (SUDAFED) 30 MG tablet Take 1 tablet (30 mg total) by mouth every 4 (four) hours as needed for congestion. 06/17/14   Barbaraann Barthel, NP   BP 121/77 mmHg  Pulse 85  Temp(Src) 98 F (36.7 C) (Oral)  Resp 16  Ht  (1.651 m)  Wt 134 lb (60.782 kg)  BMI 22.30 kg/m2  SpO2 99%  LMP 02/03/2013 Physical Exam  Constitutional: She appears well-developed and well-nourished. No distress.  Skin: Rash noted. Rash is vesicular. She is not diaphoretic. There is erythema.  Vesicular, erythematous rash on forearms with few similar lesions on abdomen and legs  Nursing note and vitals reviewed.   ED Course  Procedures (including critical care time) Labs Review Labs Reviewed - No data to display  Imaging Review No results found.   MDM   1. Contact dermatitis   (due to poison ivy)   New Prescriptions   PREDNISONE (DELTASONE) 20 MG TABLET    3 tabs po qd for qd for 3 days, then 2 tabs po qd for 4 days, then 1 tab  po qd for 4 days, then half tab po qd for 3 days   Plan: 1.  diagnosis reviewed with patient 2. rx as per orders; risks, benefits, potential side effects reviewed with patient 3. Recommend supportive treatment with otc antihistamines prn 4. F/u prn if symptoms worsen or don't improve   Payton Mccallum, MD 07/26/14 1935

## 2014-07-26 NOTE — ED Notes (Signed)
Pt states she was working out in the yard this weekend, and got into poison ivy. Pt states she has had this rash since Saturday. Rash is red, raised above the skin, scabbing over. Pt states the rash is extremely itchy.

## 2014-07-29 ENCOUNTER — Encounter: Payer: Self-pay | Admitting: Speech Pathology

## 2014-07-29 ENCOUNTER — Ambulatory Visit: Payer: Federal, State, Local not specified - PPO | Admitting: Speech Pathology

## 2014-07-29 DIAGNOSIS — R490 Dysphonia: Secondary | ICD-10-CM | POA: Diagnosis not present

## 2014-07-29 NOTE — Therapy (Signed)
[facility] MAIN [facility] SERVICES [address] [place], [place], [address] Phone: [phone]   Fax:  [phone]  Speech Language Pathology Treatment  Patient Details  Name: Kathy Kennedy MRN: [id number] Date of Birth: Apr 03, 1955 Referring Provider:  Linus Salmons, MD  Encounter Date: 07/29/2014      End of Session - 07/29/14 1555    Visit Number 4   Number of Visits 17   Date for SLP Re-Evaluation 09/06/14   SLP Start Time 0900   SLP Stop Time  0955   SLP Time Calculation (min) 55 min   Activity Tolerance Patient tolerated treatment well      Past Medical History  Diagnosis Date  . Stroke     3 years ago (mini stroke)    No past surgical history on file.  There were no vitals filed for this visit.  Visit Diagnosis: Dysphonia      Subjective Assessment - 07/29/14 1554    Subjective Patient says she feels her voice is best in the morning.   Currently in Pain? No/denies               ADULT SLP TREATMENT - 07/29/14 1553    Treatment Provided   Treatment provided Cognitive-Linquistic   Pain Assessment   Pain Assessment No/denies pain   Cognitive-Linquistic Treatment   Treatment focused on Voice   Skilled Treatment Patient used vocal loudness to achieve clear vocal quality with vowels with 100% accuracy. Patient used loudness to read phrases with a clear voice in 85% of trials. Patient read sentences of 5-6 words in length with clear voice using vocal loudness and breathing techniques in 80% of trials. Patient read sentences of 8-10 words in length with clear voice using vocal loudness and breathing techniques in 85% of trials. Patient practiced integrating her breathing and loudness into conversation.   Assessment / Recommendations / Plan   Plan Continue with current plan of care   Progression Toward Goals   Progression toward goals Progressing toward goals          SLP Education - 07/29/14 1555    Education  provided Yes   Education Details vocal loudness   Person(s) Educated Patient   Methods Explanation   Comprehension Verbalized understanding;Returned demonstration            SLP Long Term Goals - 07/13/14 1221    SLP LONG TERM GOAL #1   Title The patient will demonstrate independent understanding of vocal hygiene concepts and neck, shoulder, lingual stretching exercises.   Time 8   Period Weeks   Status New   SLP LONG TERM GOAL #2   Title The patient will be independent for abdominal breathing and breath support exercises.   Time 8   Period Weeks   Status New   SLP LONG TERM GOAL #3   Title The patient will maximize voice quality and loudness using breath support for sustained vowel production, pitch glides, and hierarchal speech drill.   Time 8   Period Weeks   Status New   SLP LONG TERM GOAL #4   Title The patient will maximize voice quality and loudness using breath support for paragraph length recitation with 80% accuracy.   Time 8   Period Weeks   Status New          Plan - 07/29/14 1555    Clinical Impression Statement Patient is making excellent progress in attaining clear vocal quality through loudness. Her self-monitoring skills of  recognizing when her vocal quality deteriorates are continuing to develop, and is able to make adjustments to correct quality. Skilled intervention is necessary to encourage patient to use the techniques she is learning to achieve clear vocal quality in conversational speech and as length of utterance expands.   Speech Therapy Frequency 2x / week   Duration Other (comment)  8 weeks   Treatment/Interventions Patient/family education;SLP instruction and feedback;Functional tasks   Potential to Achieve Goals Good   Potential Considerations Ability to learn/carryover information;Cooperation/participation level;Previous level of function;Other (comment)   SLP Home Exercise Plan Stretching, abdominal breathing, and vocal exercises, reading  sentences   Consulted and Agree with Plan of Care Patient        Problem List Patient Active Problem List   Diagnosis Date Noted  . H/O transient cerebral ischemia 05/26/2014  . Hoarse 05/26/2014  . 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 Tendinitis of wrist 05/26/2014    Havery Moros 07/29/2014, 3:56 PM  [facility] MAIN [facility] SERVICES [address] [place], [place], [address] Phone: [phone]   Fax:  [phone]

## 2014-08-01 ENCOUNTER — Ambulatory Visit: Payer: Federal, State, Local not specified - PPO | Admitting: Speech Pathology

## 2014-08-01 ENCOUNTER — Encounter: Payer: Self-pay | Admitting: Speech Pathology

## 2014-08-01 DIAGNOSIS — R490 Dysphonia: Secondary | ICD-10-CM

## 2014-08-01 NOTE — Therapy (Signed)
[facility] MAIN [facility] SERVICES [address] [place], [place], [address] Phone: [phone]   Fax:  [phone]  Speech Language Pathology Treatment  Patient Details  Name: Kathy Kennedy MRN: [id number] Date of Birth: 12-28-1955 Referring Provider:  Linus Salmons, MD  Encounter Date: 08/01/2014      End of Session - 08/01/14 1015    Visit Number 5   Number of Visits 17   Date for SLP Re-Evaluation 09/06/14   SLP Start Time 0853   SLP Stop Time  0952   SLP Time Calculation (min) 59 min   Activity Tolerance Patient tolerated treatment well      Past Medical History  Diagnosis Date  . Stroke     3 years ago (mini stroke)    History reviewed. No pertinent past surgical history.  There were no vitals filed for this visit.  Visit Diagnosis: Dysphonia      Subjective Assessment - 08/01/14 1013    Subjective Pt reports that her voice is still the best in the morning and that in the evening it is really difficult for her to obtain a good quality voice.    Currently in Pain? No/denies               ADULT SLP TREATMENT - 08/01/14 1009    General Information   Behavior/Cognition Alert;Pleasant mood   Treatment Provided   Treatment provided Cognitive-Linquistic   Pain Assessment   Pain Assessment No/denies pain   Cognitive-Linquistic Treatment   Treatment focused on Voice   Skilled Treatment Patient used vocal loudness to achieve clear vocal quality with vowels with 100% accuracy. Patient used loudness to read phrases with a clear voice in 80% of trials. Patient read sentences of 5-6 words in length with clear voice using vocal loudness and breathing techniques in 80% of trials with self-correction. Patient read sentences of 8-10 words in length with clear voice using vocal loudness and breathing techniques in 75% of trials with mod verbal cues for breath support. Patient practiced integrating her breathing and loudness into  conversation. Pt requires cues to maintain breath support as well as avoiding hard onsets.    Assessment / Recommendations / Plan   Plan Continue with current plan of care   Progression Toward Goals   Progression toward goals Progressing toward goals          SLP Education - 08/01/14 1015    Education provided Yes   Education Details voca loudness and breath support   Person(s) Educated Patient   Methods Explanation;Demonstration   Comprehension Verbalized understanding;Returned demonstration            SLP Long Term Goals - 07/13/14 1221    SLP LONG TERM GOAL #1   Title The patient will demonstrate independent understanding of vocal hygiene concepts and neck, shoulder, lingual stretching exercises.   Time 8   Period Weeks   Status New   SLP LONG TERM GOAL #2   Title The patient will be independent for abdominal breathing and breath support exercises.   Time 8   Period Weeks   Status New   SLP LONG TERM GOAL #3   Title The patient will maximize voice quality and loudness using breath support for sustained vowel production, pitch glides, and hierarchal speech drill.   Time 8   Period Weeks   Status New   SLP LONG TERM GOAL #4   Title The patient will maximize voice quality and loudness using breath  support for paragraph length recitation with 80% accuracy.   Time 8   Period Weeks   Status New          Plan - 08/01/14 1016    Clinical Impression Statement Patient is making excellent progress in attaining clear vocal quality through loudness. Her self-monitoring skills of recognizing when her vocal quality deteriorates are continuing to develop, and is able to make adjustments to correct quality. Skilled intervention is necessary to encourage patient to use the techniques she is learning to achieve clear vocal quality in conversational speech and as length of utterance expands.   Speech Therapy Frequency 2x / week   Duration Other (comment)  8 weeks    Treatment/Interventions Patient/family education;SLP instruction and feedback;Functional tasks   Potential to Achieve Goals Good   Potential Considerations Ability to learn/carryover information;Cooperation/participation level;Previous level of function;Other (comment)   SLP Home Exercise Plan Stretching, abdominal breathing, and vocal exercises, reading sentences   Consulted and Agree with Plan of Care Patient        Problem List Patient Active Problem List   Diagnosis Date Noted  . H/O transient cerebral ischemia 05/26/2014  . Hoarse 05/26/2014  . 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 Tendinitis of wrist 05/26/2014    [place],Shamela Haydon 08/01/2014, 10:18 AM  [facility] MAIN [facility] SERVICES [address] [place], [place], [address] Phone: [phone]   Fax:  [phone]

## 2014-08-04 ENCOUNTER — Encounter: Payer: Self-pay | Admitting: Speech Pathology

## 2014-08-04 ENCOUNTER — Ambulatory Visit: Payer: Federal, State, Local not specified - PPO | Admitting: Speech Pathology

## 2014-08-04 DIAGNOSIS — R490 Dysphonia: Secondary | ICD-10-CM | POA: Diagnosis not present

## 2014-08-04 NOTE — Therapy (Signed)
[facility] [address] [place], [place], [address] Phone: [phone]   Fax:  [phone]  Speech Language Pathology Treatment  Patient Details  Name: Kathy RobinsBelinda L Legore MRN: [id number] Date of Birth: 30-May-1955 Referring Provider:  Linus SalmonsMcQueen, Chapman, MD  Encounter Date: 08/04/2014      End of Session - 08/04/14 1710    Visit Number 6   Number of Visits 17   Date for SLP Re-Evaluation 09/06/14   SLP Start Time 1605   SLP Stop Time  1700   SLP Time Calculation (min) 55 min   Activity Tolerance Patient tolerated treatment well      Past Medical History  Diagnosis Date  . Stroke     3 years ago (mini stroke)    History reviewed. No pertinent past surgical history.  There were no vitals filed for this visit.  Visit Diagnosis: Dysphonia      Subjective Assessment - 08/04/14 1709    Subjective Pt reports that her voice is still the best in the morning and that in the evening it is really difficult for her to obtain a good quality voice.    Currently in Pain? No/denies               ADULT SLP TREATMENT - 08/04/14 1703    General Information   Behavior/Cognition Alert;Pleasant mood   Treatment Provided   Treatment provided Cognitive-Linquistic   Pain Assessment   Pain Assessment No/denies pain   Cognitive-Linquistic Treatment   Treatment focused on Voice   Skilled Treatment Patient generated sentences using breath support/vocal loudness for clear vocal quality with 80% accuracy.  Her pitch is elevated, but vocal quality is clear.  Using visi pitch fundamental frequency games, the patient was able to generate clear vocal quality sustained vowel and continuous phonation phrase with 80% accuracy.  Generate phrases at more appropriate pitch with loud, good quality voice with 65% accuracy.   Assessment / Recommendations / Plan   Plan Continue with current plan of care   Progression Toward Goals   Progression  toward goals Progressing toward goals          SLP Education - 08/04/14 1710    Education provided Yes   Education Details Vocal loudness, pitch control, continous flow phonation, breath support   Person(s) Educated Patient   Methods Explanation;Demonstration   Comprehension Verbalized understanding;Returned demonstration;Tactile cues required;Need further instruction            SLP Long Term Goals - 07/13/14 1221    SLP LONG TERM GOAL #1   Title The patient will demonstrate independent understanding of vocal hygiene concepts and neck, shoulder, lingual stretching exercises.   Time 8   Period Weeks   Status New   SLP LONG TERM GOAL #2   Title The patient will be independent for abdominal breathing and breath support exercises.   Time 8   Period Weeks   Status New   SLP LONG TERM GOAL #3   Title The patient will maximize voice quality and loudness using breath support for sustained vowel production, pitch glides, and hierarchal speech drill.   Time 8   Period Weeks   Status New   SLP LONG TERM GOAL #4   Title The patient will maximize voice quality and loudness using breath support for paragraph length recitation with 80% accuracy.   Time 8   Period Weeks   Status New          Problem  List Patient Active Problem List   Diagnosis Date Noted  . H/O transient cerebral ischemia 05/26/2014  . Hoarse 05/26/2014  . 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 Tendinitis of wrist 05/26/2014   Dollene Primrose, MS/CCC- SLP  Leandrew Koyanagi 08/04/2014, 5:12 PM  [facility] MAIN [facility] SERVICES [address] [place], [place], [address] Phone: [phone]   Fax:  [phone]

## 2014-08-10 ENCOUNTER — Encounter: Payer: Self-pay | Admitting: Speech Pathology

## 2014-08-10 ENCOUNTER — Ambulatory Visit: Payer: Federal, State, Local not specified - PPO | Attending: Unknown Physician Specialty | Admitting: Speech Pathology

## 2014-08-10 DIAGNOSIS — J3801 Paralysis of vocal cords and larynx, unilateral: Secondary | ICD-10-CM | POA: Diagnosis not present

## 2014-08-10 DIAGNOSIS — R490 Dysphonia: Secondary | ICD-10-CM | POA: Insufficient documentation

## 2014-08-10 NOTE — Therapy (Signed)
[facility] [address] [place], [place], [address] Phone: [phone]   Fax:  [phone]  Speech Language Pathology Treatment  Patient Details  Name: Kathy Kennedy MRN: [id number] Date of Birth: 1955/09/28 Referring Provider:  Linus Salmons, MD  Encounter Date: 08/10/2014      End of Session - 08/10/14 1759    Visit Number 7   Number of Visits 17   Date for SLP Re-Evaluation 09/06/14   SLP Start Time 1603   SLP Stop Time  1653   SLP Time Calculation (min) 50 min   Activity Tolerance Patient tolerated treatment well      Past Medical History  Diagnosis Date  . Stroke     3 years ago (mini stroke)    No past surgical history on file.  There were no vitals filed for this visit.  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   Currently in Pain? No/denies               ADULT SLP TREATMENT - 08/10/14 1757    General Information   Behavior/Cognition Alert;Pleasant mood   Treatment Provided   Treatment provided Cognitive-Linquistic   Pain Assessment   Pain Assessment No/denies pain   Cognitive-Linquistic Treatment   Treatment focused on Voice   Skilled Treatment Patient used Visi-Pitch fundamental frequency games achieve clear vocal quality with lower pitch vowels with 75% accuracy. After instruction and practice, patient generated clear vocal quality during sustained vowels and continuous phonation phrases with 85% accuracy.     Assessment / Recommendations / Plan   Plan Continue with current plan of care   Progression Toward Goals   Progression toward goals Progressing toward goals          SLP Education - 08/10/14 1759    Education provided Yes   Education Details vocal loudness, pitch control, continuous flow phonation, breath support   Person(s) Educated  Patient   Methods Explanation;Demonstration   Comprehension Verbalized understanding;Returned demonstration;Verbal cues required            SLP Long Term Goals - 07/13/14 1221    SLP LONG TERM GOAL #1   Title The patient will demonstrate independent understanding of vocal hygiene concepts and neck, shoulder, lingual stretching exercises.   Time 8   Period Weeks   Status New   SLP LONG TERM GOAL #2   Title The patient will be independent for abdominal breathing and breath support exercises.   Time 8   Period Weeks   Status New   SLP LONG TERM GOAL #3   Title The patient will maximize voice quality and loudness using breath support for sustained vowel production, pitch glides, and hierarchal speech drill.   Time 8   Period Weeks   Status New   SLP LONG TERM GOAL #4   Title The patient will maximize voice quality and loudness using breath support for paragraph length recitation with 80% accuracy.   Time 8   Period Weeks   Status New          Plan - 08/10/14 1759    Clinical Impression Statement Patient is making progress in attaining clear vocal quality through loudness while using a more appropriate pitch. Skilled intervention is necessary to encourage patient to use the techniques she is learning to achieve clear vocal  quality in conversational speech while learning to self-monitor her pitch.   Speech Therapy Frequency 2x / week   Duration Other (comment)  8 weeks   Treatment/Interventions Patient/family education;SLP instruction and feedback;Functional tasks   Potential to Achieve Goals Good   Potential Considerations Ability to learn/carryover information;Cooperation/participation level;Previous level of function;Other (comment)   SLP Home Exercise Plan Stretching, abdominal breathing, and vocal exercises, reading sentences   Consulted and Agree with Plan of Care Patient        Problem List Patient Active Problem List   Diagnosis Date Noted  . H/O transient  cerebral ischemia 05/26/2014  . Hoarse 05/26/2014  . 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 Tendinitis of wrist 05/26/2014    Kathy Kennedy 08/10/2014, 6:00 PM  [facility] [address] [place], [place], [address] Phone: [phone]   Fax:  [phone]

## 2014-08-16 ENCOUNTER — Ambulatory Visit: Payer: Federal, State, Local not specified - PPO | Admitting: Speech Pathology

## 2014-08-16 DIAGNOSIS — R490 Dysphonia: Secondary | ICD-10-CM

## 2014-08-16 DIAGNOSIS — J3801 Paralysis of vocal cords and larynx, unilateral: Secondary | ICD-10-CM | POA: Diagnosis not present

## 2014-08-17 ENCOUNTER — Encounter: Payer: Self-pay | Admitting: Speech Pathology

## 2014-08-17 NOTE — Therapy (Signed)
[place] MAIN [facility] SERVICES [address] [place], [place], [address] Phone: [phone]   Fax:  [phone]  Speech Language Pathology Treatment/ Progress Note  Patient Details  Name: Kathy Kennedy MRN: [id number] Date of Birth: 10-29-1955 Referring Provider:  Adline Potter, MD  Encounter Date: 08/16/2014      End of Session - 08/17/14 1515    Visit Number 8   Number of Visits 17   Date for SLP Re-Evaluation 09/06/14   SLP Start Time 1430   SLP Stop Time  1510   SLP Time Calculation (min) 40 min   Activity Tolerance Patient tolerated treatment well      Past Medical History  Diagnosis Date  . Stroke     3 years ago (mini stroke)    No past surgical history on file.  There were no vitals filed for this visit.  Visit Diagnosis: Dysphonia      Subjective Assessment - 08/17/14 1515    Subjective Patient said her mother-in-law commented on her voice sounding better.   Currently in Pain? No/denies               ADULT SLP TREATMENT - 08/17/14 0001    General Information   Behavior/Cognition Alert;Pleasant mood   Treatment Provided   Treatment provided Cognitive-Linquistic   Pain Assessment   Pain Assessment No/denies pain   Cognitive-Linquistic Treatment   Treatment focused on Voice   Skilled Treatment Focusing on using a strong, clear voice, patient responded with one word answers to simple questions while self-correcting vocal quality with 85% accuracy. With external verbal cues, patient was able to produce a clear voice in 95% of opportunities. Responding to questions with phrases, patient achieved a clear voice at her desired pitch level with 87% accuracy. Patient read sentences with clear voice with 89% accuracy. Patient read paragraphs with a clear voice while self-monitoring for clear vocal quality.    Assessment / Recommendations / Plan   Plan Continue with current plan of care   Progression Toward Goals   Progression toward goals Progressing toward goals          SLP Education - 08/17/14 1515    Education provided Yes   Education Details vocal loudness, breath support   Person(s) Educated Patient   Methods Explanation;Demonstration   Comprehension Verbalized understanding;Returned demonstration            SLP Long Term Goals - 08/17/14 1516    SLP LONG TERM GOAL #1   Title The patient will demonstrate independent understanding of vocal hygiene concepts and neck, shoulder, lingual stretching exercises.   Time 8   Period Weeks   Status Achieved   SLP LONG TERM GOAL #2   Title The patient will be independent for abdominal breathing and breath support exercises.   Time 8   Period Weeks   Status Achieved   SLP LONG TERM GOAL #3   Title The patient will maximize voice quality and loudness using breath support for sustained vowel production, pitch glides, and hierarchal speech drill.   Time 8   Period Weeks   Status Partially Met   SLP LONG TERM GOAL #4   Title The patient will maximize voice quality and loudness using breath support for paragraph length recitation with 80% accuracy.   Time 8   Period Weeks   Status Partially Met          Plan - 08/17/14 1516    Clinical Impression Statement Patient is  making progress in attaining clear vocal quality through loudness while using a more appropriate pitch. Skilled intervention is necessary to encourage patient to use the techniques she is learning to achieve clear vocal quality in conversational speech.   Speech Therapy Frequency 2x / week   Duration Other (comment)  8 weeks   Treatment/Interventions Patient/family education;SLP instruction and feedback;Functional tasks   Potential to Achieve Goals Good   Potential Considerations Ability to learn/carryover information;Cooperation/participation level;Previous level of function;Other (comment)   SLP Home Exercise Plan Stretching, abdominal breathing, and vocal exercises,  reading sentences   Consulted and Agree with Plan of Care Patient        Problem List Patient Active Problem List   Diagnosis Date Noted  . H/O transient cerebral ischemia 05/26/2014  . Hoarse 05/26/2014  . 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 Tendinitis of wrist 05/26/2014    Erma Pinto 08/17/2014, 3:18 PM  [place] MAIN [facility] SERVICES [address] [place], [place], [address] Phone: [phone]   Fax:  [phone]

## 2014-08-18 ENCOUNTER — Ambulatory Visit: Payer: Federal, State, Local not specified - PPO | Admitting: Speech Pathology

## 2014-08-18 DIAGNOSIS — R490 Dysphonia: Secondary | ICD-10-CM

## 2014-08-18 DIAGNOSIS — J3801 Paralysis of vocal cords and larynx, unilateral: Secondary | ICD-10-CM | POA: Diagnosis not present

## 2014-08-19 ENCOUNTER — Encounter: Payer: Self-pay | Admitting: Speech Pathology

## 2014-08-19 NOTE — Therapy (Signed)
[place] MAIN [facility] SERVICES [address] [place], [place], [address] Phone: [phone]   Fax:  [phone]  Speech Language Pathology Treatment  Patient Details  Name: Kathy Kennedy MRN: [id number] Date of Birth: 01-09-1956 Referring Provider:  Beverly Gust, MD  Encounter Date: 08/18/2014      End of Session - 08/19/14 1246    Visit Number 9   Number of Visits 17   Date for SLP Re-Evaluation 09/06/14   SLP Start Time 1600   SLP Stop Time  1655   SLP Time Calculation (min) 55 min   Activity Tolerance Patient tolerated treatment well      Past Medical History  Diagnosis Date  . Stroke     3 years ago (mini stroke)    No past surgical history on file.  There were no vitals filed for this visit.  Visit Diagnosis: Dysphonia      Subjective Assessment - 08/19/14 1245    Subjective Patient said she has been trying to remind herself to use her strong, clear voice.   Currently in Pain? No/denies               ADULT SLP TREATMENT - 08/19/14 0001    General Information   Behavior/Cognition Alert;Pleasant mood   Treatment Provided   Treatment provided Cognitive-Linquistic   Pain Assessment   Pain Assessment No/denies pain   Cognitive-Linquistic Treatment   Treatment focused on Voice   Skilled Treatment Patient read phrases with clear voice with 95% accuracy. Focusing on using a strong, clear voice, patient responded with phrase level answers to simple questions while self-correcting vocal quality with 95% accuracy. Patient read 2-4 sentence paragraphs with a clear voice with 80% accuracy, and was able to self-correct poor vocal quality up to 95%. Patient read paragraphs with a clear voice while self-monitoring for clear vocal quality.    Assessment / Recommendations / Plan   Plan Continue with current plan of care   Progression Toward Goals   Progression toward goals Progressing toward goals          SLP  Education - 08/19/14 1246    Education provided Yes   Education Details vocal loudness   Person(s) Educated Patient   Methods Explanation;Demonstration   Comprehension Verbalized understanding;Returned demonstration            SLP Long Term Goals - 08/17/14 1516    SLP LONG TERM GOAL #1   Title The patient will demonstrate independent understanding of vocal hygiene concepts and neck, shoulder, lingual stretching exercises.   Time 8   Period Weeks   Status Achieved   SLP LONG TERM GOAL #2   Title The patient will be independent for abdominal breathing and breath support exercises.   Time 8   Period Weeks   Status Achieved   SLP LONG TERM GOAL #3   Title The patient will maximize voice quality and loudness using breath support for sustained vowel production, pitch glides, and hierarchal speech drill.   Time 8   Period Weeks   Status Partially Met   SLP LONG TERM GOAL #4   Title The patient will maximize voice quality and loudness using breath support for paragraph length recitation with 80% accuracy.   Time 8   Period Weeks   Status Partially Met          Plan - 08/19/14 1246    Clinical Impression Statement Patient is making progress in attaining clear vocal quality through loudness  while using a more appropriate pitch. Patient requires moderate external cues to use vocal techniques in unstructured conversation. Skilled intervention is necessary to encourage patient to use the techniques she is learning to achieve clear vocal quality in conversational speech.   Speech Therapy Frequency 2x / week   Duration Other (comment)  8 weeks   Treatment/Interventions Patient/family education;SLP instruction and feedback;Functional tasks   Potential to Achieve Goals Good   Potential Considerations Ability to learn/carryover information;Cooperation/participation level;Previous level of function;Other (comment)   SLP Home Exercise Plan Stretching, abdominal breathing, and vocal  exercises, reading sentences   Consulted and Agree with Plan of Care Patient        Problem List Patient Active Problem List   Diagnosis Date Noted  . H/O transient cerebral ischemia 05/26/2014  . Hoarse 05/26/2014  . 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 Tendinitis of wrist 05/26/2014    Erma Pinto 08/19/2014, 12:47 PM  [place] MAIN [facility] SERVICES [address] [place], [place], [address] Phone: [phone]   Fax:  [phone]

## 2014-08-23 ENCOUNTER — Ambulatory Visit: Payer: Federal, State, Local not specified - PPO | Admitting: Speech Pathology

## 2014-08-23 ENCOUNTER — Encounter: Payer: Self-pay | Admitting: Speech Pathology

## 2014-08-23 DIAGNOSIS — J3801 Paralysis of vocal cords and larynx, unilateral: Secondary | ICD-10-CM | POA: Diagnosis not present

## 2014-08-23 DIAGNOSIS — R490 Dysphonia: Secondary | ICD-10-CM

## 2014-08-23 NOTE — Therapy (Signed)
[place] MAIN [facility] SERVICES [address] [place], [place], [address] Phone: [phone]   Fax:  [phone]  Speech Language Pathology Treatment  Patient Details  Name: Kathy Kennedy MRN: [id number] Date of Birth: March 20, 1955 Referring Provider:  Adline Potter, MD  Encounter Date: 08/23/2014      End of Session - 08/23/14 1743    Visit Number 10   Number of Visits 17   Date for SLP Re-Evaluation 09/06/14   SLP Start Time 1600   SLP Stop Time  1655   SLP Time Calculation (min) 55 min   Activity Tolerance Patient tolerated treatment well      Past Medical History  Diagnosis Date  . Stroke     3 years ago (mini stroke)    No past surgical history on file.  There were no vitals filed for this visit.  Visit Diagnosis: Dysphonia      Subjective Assessment - 08/23/14 1742    Subjective Patient was using a loud, clear voice when she arrived for her session.   Currently in Pain? No/denies               ADULT SLP TREATMENT - 08/23/14 1742    General Information   Behavior/Cognition Alert;Pleasant mood   Treatment Provided   Treatment provided Cognitive-Linquistic   Pain Assessment   Pain Assessment No/denies pain   Cognitive-Linquistic Treatment   Treatment focused on Voice   Skilled Treatment Patient discussed her home practice and progress while self-monitoring voice quality with a clear voice 90% of the time. Patient read paragraphs with a clear voice while self-monitoring for clear vocal quality with 90% accuracy. Patient mentioned she would like to record her voice to increase her self-monitoring abilities. She downloaded an application for her phone and practiced using it to record herself using her loud, clear voice.   Assessment / Recommendations / Plan   Plan Continue with current plan of care   Progression Toward Goals   Progression toward goals Progressing toward goals          SLP Education -  08/23/14 1743    Education provided Yes   Education Details vocal loudness   Person(s) Educated Patient   Methods Explanation;Demonstration   Comprehension Verbalized understanding;Returned demonstration            SLP Long Term Goals - 08/17/14 1516    SLP LONG TERM GOAL #1   Title The patient will demonstrate independent understanding of vocal hygiene concepts and neck, shoulder, lingual stretching exercises.   Time 8   Period Weeks   Status Achieved   SLP LONG TERM GOAL #2   Title The patient will be independent for abdominal breathing and breath support exercises.   Time 8   Period Weeks   Status Achieved   SLP LONG TERM GOAL #3   Title The patient will maximize voice quality and loudness using breath support for sustained vowel production, pitch glides, and hierarchal speech drill.   Time 8   Period Weeks   Status Partially Met   SLP LONG TERM GOAL #4   Title The patient will maximize voice quality and loudness using breath support for paragraph length recitation with 80% accuracy.   Time 8   Period Weeks   Status Partially Met          Plan - 08/23/14 1743    Clinical Impression Statement Patient is attaining clear vocal quality through loudness while using a more appropriate pitch.  Patient is monitoring and self-correcting her pitch with greater accuracy, but she will benefit from skilled intervention and monitoring to ensure she generalizes techniques into conversation.    Speech Therapy Frequency 2x / week   Duration Other (comment)  8 weeks   Treatment/Interventions Patient/family education;SLP instruction and feedback;Functional tasks   Potential to Achieve Goals Good   Potential Considerations Ability to learn/carryover information;Cooperation/participation level;Previous level of function;Other (comment)   SLP Home Exercise Plan Stretching, abdominal breathing, and vocal exercises, reading sentences   Consulted and Agree with Plan of Care Patient         Problem List Patient Active Problem List   Diagnosis Date Noted  . H/O transient cerebral ischemia 05/26/2014  . Hoarse 05/26/2014  . 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 Tendinitis of wrist 05/26/2014    Erma Pinto 08/23/2014, 5:44 PM  [place] MAIN [facility] SERVICES [address] [place], [place], [address] Phone: [phone]   Fax:  [phone]

## 2014-08-25 ENCOUNTER — Ambulatory Visit: Payer: Federal, State, Local not specified - PPO | Admitting: Speech Pathology

## 2014-08-25 DIAGNOSIS — J3801 Paralysis of vocal cords and larynx, unilateral: Secondary | ICD-10-CM | POA: Diagnosis not present

## 2014-08-25 DIAGNOSIS — R490 Dysphonia: Secondary | ICD-10-CM

## 2014-08-26 ENCOUNTER — Encounter: Payer: Self-pay | Admitting: Speech Pathology

## 2014-08-26 NOTE — Therapy (Signed)
[place] MAIN [facility] SERVICES [address] [place], [place], [address] Phone: [phone]   Fax:  [phone]  Speech Language Pathology Discharge Summary  Patient Details  Name: Kathy Kennedy MRN: [id number] Date of Birth: 06-03-1955 Referring Provider:  Beverly Gust, MD  Encounter Date: 08/25/2014      End of Session - 08/26/14 1418    Visit Number 11   Number of Visits 17   Date for SLP Re-Evaluation 09/06/14   SLP Start Time 1600   SLP Stop Time  1650   SLP Time Calculation (min) 50 min   Activity Tolerance Patient tolerated treatment well      Past Medical History  Diagnosis Date  . Stroke     3 years ago (mini stroke)    No past surgical history on file.  There were no vitals filed for this visit.  Visit Diagnosis: Dysphonia      Subjective Assessment - 08/26/14 1418    Subjective Patient was using a loud, clear voice when she arrived for her session. She said she is pleased with her voice outcomes and feels that she will be able to avoid having surgery.   Currently in Pain? No/denies               ADULT SLP TREATMENT - 08/26/14 0001    General Information   Behavior/Cognition Alert;Pleasant mood   Treatment Provided   Treatment provided Cognitive-Linquistic   Pain Assessment   Pain Assessment No/denies pain   Cognitive-Linquistic Treatment   Treatment focused on Voice   Skilled Treatment Patient discussed her readiness to continue using her better quality voice independently. She has begun using tools for self-monitoring and is able to achieve a good quality voice through vocal loudness in her home practice and in conversation. Patient read paragraphs with a clear voice while self-monitoring and correcting for clear vocal quality with 95% accuracy. Verbally educated patient about continuing to monitor her use of voice in her daily life and provided information for maintenance vocal exercise routine of  vocal loudness for 10 minutes per day.    Assessment / Recommendations / Plan   Plan Continue with current plan of care   Progression Toward Goals   Progression toward goals Goals met, education completed, patient discharged from SLP          SLP Education - 08/26/14 1418    Education provided Yes   Education Details vocal loudness, pitch control, maintenance routine   Person(s) Educated Patient   Methods Explanation;Demonstration   Comprehension Verbalized understanding;Returned demonstration            SLP Long Term Goals - 08/26/14 1420    SLP LONG TERM GOAL #1   Title The patient will demonstrate independent understanding of vocal hygiene concepts and neck, shoulder, lingual stretching exercises.   Time 8   Period Weeks   Status Achieved   SLP LONG TERM GOAL #2   Title The patient will be independent for abdominal breathing and breath support exercises.   Time 8   Period Weeks   Status Achieved   SLP LONG TERM GOAL #3   Title The patient will maximize voice quality and loudness using breath support for sustained vowel production, pitch glides, and hierarchal speech drill.   Time 8   Period Weeks   Status Achieved   SLP LONG TERM GOAL #4   Title The patient will maximize voice quality and loudness using breath support for paragraph length recitation  with 80% accuracy.   Time 8   Period Weeks   Status Achieved          Plan - 08/26/14 1419    Clinical Impression Statement Patient is attaining clear vocal quality through loudness while using a more appropriate pitch. Patient monitors and self-corrects her pitch and vocal quality, and feels comfortable implementing the techniques she has learned independently.    Speech Therapy Frequency 2x / week   Duration Other (comment)  8 weeks   Treatment/Interventions Patient/family education;SLP instruction and feedback;Functional tasks   Potential to Achieve Goals Good   Potential Considerations Ability to  learn/carryover information;Cooperation/participation level;Previous level of function;Other (comment)   SLP Home Exercise Plan Vocal loudness maintenance routine   Consulted and Agree with Plan of Care Patient        Problem List Patient Active Problem List   Diagnosis Date Noted  . H/O transient cerebral ischemia 05/26/2014  . Hoarse 05/26/2014  . 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 Tendinitis of wrist 05/26/2014    Erma Pinto 08/26/2014, 2:20 PM  [place] MAIN [facility] SERVICES [address] [place], [place], [address] Phone: [phone]   Fax:  [phone]

## 2014-11-21 ENCOUNTER — Encounter: Payer: Self-pay | Admitting: Emergency Medicine

## 2014-11-21 ENCOUNTER — Ambulatory Visit
Admission: EM | Admit: 2014-11-21 | Discharge: 2014-11-21 | Disposition: A | Discharge status: Home / Self Care | Payer: Federal, State, Local not specified - PPO | Attending: Family Medicine | Admitting: Family Medicine

## 2014-11-21 DIAGNOSIS — M25511 Pain in right shoulder: Secondary | ICD-10-CM | POA: Diagnosis not present

## 2014-11-21 DIAGNOSIS — R5383 Other fatigue: Secondary | ICD-10-CM

## 2014-11-21 DIAGNOSIS — M79644 Pain in right finger(s): Secondary | ICD-10-CM

## 2014-11-21 LAB — CBC WITH DIFFERENTIAL/PLATELET
Basophils Absolute: 0.1 K/uL (ref 0–0.1)
Basophils Relative: 1 %
Eosinophils Absolute: 0.2 K/uL (ref 0–0.7)
Eosinophils Relative: 3 %
HCT: 40.2 % (ref 35.0–47.0)
Hemoglobin: 13.5 g/dL (ref 12.0–16.0)
Lymphocytes Relative: 40 %
Lymphs Abs: 2.2 K/uL (ref 1.0–3.6)
MCH: 29.9 pg (ref 26.0–34.0)
MCHC: 33.6 g/dL (ref 32.0–36.0)
MCV: 89.1 fL (ref 80.0–100.0)
Monocytes Absolute: 0.5 K/uL (ref 0.2–0.9)
Monocytes Relative: 9 %
Neutro Abs: 2.7 K/uL (ref 1.4–6.5)
Neutrophils Relative %: 47 %
Platelets: 205 K/uL (ref 150–440)
RBC: 4.52 MIL/uL (ref 3.80–5.20)
RDW: 13.5 % (ref 11.5–14.5)
WBC: 5.6 K/uL (ref 3.6–11.0)

## 2014-11-21 LAB — COMPREHENSIVE METABOLIC PANEL
ALK PHOS: 59 U/L (ref 38–126)
ALT: 20 U/L (ref 14–54)
AST: 23 U/L (ref 15–41)
Albumin: 4.1 g/dL (ref 3.5–5.0)
Anion gap: 9 (ref 5–15)
BUN: 17 mg/dL (ref 6–20)
CALCIUM: 9 mg/dL (ref 8.9–10.3)
CO2: 28 mmol/L (ref 22–32)
CREATININE: 0.72 mg/dL (ref 0.44–1.00)
Chloride: 101 mmol/L (ref 101–111)
GFR calc non Af Amer: >60 mL/min (ref >60)
Glucose, Bld: 96 mg/dL (ref 65–99)
Potassium: 4.3 mmol/L (ref 3.5–5.1)
Sodium: 138 mmol/L (ref 135–145)
Total Bilirubin: 0.3 mg/dL (ref 0.3–1.2)
Total Protein: 6.8 g/dL (ref 6.5–8.1)

## 2014-11-21 LAB — TSH: TSH: 1.717 u[IU]/mL (ref 0.350–4.500)

## 2014-11-21 NOTE — ED Notes (Signed)
Patient c/o fatigue and no energy for couple of days.  Patient denies any cold symptoms.

## 2014-11-21 NOTE — ED Provider Notes (Signed)
CSN: [id number]     Arrival date & time 11/21/14  1529 History   First MD Initiated Contact with Patient 11/21/14 1638     Chief Complaint  Patient presents with  . Fatigue   (Consider location/radiation/quality/duration/timing/severity/associated sxs/prior Treatment) HPI Comments: 59 yo female with a 2-3 days h/o fatigue and low energy. Denies any other symptoms. Denies fevers, chills, chest pains, rash, nasal congestion, cough, sore throat, dysuria, vomiting, diarrhea, melena, hematochezia.   Patient also complains of a 3 months h/o intermittent right shoulder and right thumb pain. Denies any specific injuries, falls, trauma, swelling, redness.   The history is provided by the patient.    Past Medical History  Diagnosis Date  . Stroke [facility](HCC)     3 years ago (mini stroke)   History reviewed. No pertinent past surgical history. Family History  Problem Relation Age of Onset  . Hyperlipidemia Mother   . Heart attack Father    Social History  Substance Use Topics  . Smoking status: Former Smoker -- 1.00 packs/day for 10 years    Quit date: 02/04/1984  . Smokeless tobacco: Never Used  . Alcohol Use: No   OB History    Gravida Para Term Preterm AB TAB SAB Ectopic Multiple Living   0 0 0 0 0 0 0 0 0 0      Review of Systems  Allergies  Molds & smuts  Home Medications   Prior to Admission medications   Medication Sig Start Date End Date Taking? Authorizing Provider  albuterol (PROVENTIL HFA;VENTOLIN HFA) 108 (90 BASE) MCG/ACT inhaler Inhale 2 puffs into the lungs every 6 (six) hours as needed for wheezing or shortness of breath (cough). 06/20/14   Gayla DossEryka A Gayle, MD  aspirin 325 MG EC tablet Take 1 tablet by mouth daily.    Historical Provider, MD  cyclobenzaprine (FLEXERIL) 5 MG tablet Take 1 tablet (5 mg total) by mouth 3 (three) times daily as needed for muscle spasms. 06/17/14   Barbaraann Barthelina A Betancourt, NP  ibuprofen (ADVIL,MOTRIN) 800 MG tablet Take 1 tablet (800 mg total) by  mouth every 8 (eight) hours as needed for moderate pain. 06/17/14   Barbaraann Barthelina A Betancourt, NP  predniSONE (DELTASONE) 20 MG tablet 3 tabs po qd for qd for 3 days, then 2 tabs po qd for 4 days, then 1 tab po qd for 4 days, then half tab po qd for 3 days 07/26/14   Payton Mccallumrlando Yarden Manuelito, MD  pseudoephedrine (SUDAFED) 30 MG tablet Take 1 tablet (30 mg total) by mouth every 4 (four) hours as needed for congestion. 06/17/14   Barbaraann Barthelina A Betancourt, NP   Meds Ordered and Administered this Visit  Medications - No data to display  BP 126/62 mmHg  Pulse 79  Temp(Src) 96.8 F (36 C) (Tympanic)  Resp 16  Ht 5\' 4"  (1.626 m)  Wt 140 lb (63.504 kg)  BMI 24.02 kg/m2  SpO2 98%  LMP 02/03/2013 No data found.   Physical Exam  Constitutional: She appears well-developed and well-nourished. No distress.  Neck: Neck supple. No thyromegaly present.  Cardiovascular: Normal rate, regular rhythm, normal heart sounds and intact distal pulses.   No murmur heard. Pulmonary/Chest: Effort normal and breath sounds normal. No respiratory distress. She has no wheezes. She has no rales.  Musculoskeletal: She exhibits no edema.  Right thumb and shoulder with mild articular crepitus; no erythema, no edema; normal ROM; extremity neurovascularly intact  Neurological: She is alert.  Skin: No rash noted. She is not diaphoretic.  Nursing note and vitals reviewed.   ED Course  Procedures (including critical care time)  Labs Review Labs Reviewed  COMPREHENSIVE METABOLIC PANEL  CBC WITH DIFFERENTIAL/PLATELET  TSH    Imaging Review No results found.   Visual Acuity Review  Right Eye Distance:   Left Eye Distance:   Bilateral Distance:    Right Eye Near:   Left Eye Near:    Bilateral Near:         MDM   1. Other fatigue   2. Right shoulder pain   3. Thumb pain, right    1. Labs results (normal CMP, CBC)  reviewed with patient 2. TSH test result pending; patient will be notified 3. Discussed possible etiologies  with patient 4. otc analgesics/NSAIDs prn 5. Follow up with PCP   Payton Mccallum, MD 11/21/14 [phone]

## 2014-12-28 ENCOUNTER — Ambulatory Visit: Payer: Self-pay | Admitting: Family Medicine

## 2015-01-11 ENCOUNTER — Encounter: Payer: Self-pay | Admitting: Family Medicine

## 2015-01-11 ENCOUNTER — Ambulatory Visit (INDEPENDENT_AMBULATORY_CARE_PROVIDER_SITE_OTHER): Payer: Federal, State, Local not specified - PPO | Admitting: Family Medicine

## 2015-01-11 VITALS — BP 110/78 | HR 68 | Ht 64.0 in | Wt 135.0 lb

## 2015-01-11 DIAGNOSIS — M778 Other enthesopathies, not elsewhere classified: Secondary | ICD-10-CM

## 2015-01-11 DIAGNOSIS — M7581 Other shoulder lesions, right shoulder: Secondary | ICD-10-CM

## 2015-01-11 DIAGNOSIS — M6588 Other synovitis and tenosynovitis, other site: Secondary | ICD-10-CM

## 2015-01-11 DIAGNOSIS — Z23 Encounter for immunization: Secondary | ICD-10-CM

## 2015-01-11 DIAGNOSIS — M779 Enthesopathy, unspecified: Secondary | ICD-10-CM

## 2015-01-11 NOTE — Progress Notes (Signed)
Date:  01/11/2015   Name:  Kathy RobinsBelinda L Kennedy   DOB:  October 27, 1955   MRN:  [id number]  PCP:  Schuyler AmorWilliam Karron Alvizo, MD    Chief Complaint: Shoulder Pain and thumb pain   History of Present Illness:  This is a 59 y.o. female who reports persistent R shoulder s/p fall in July, hurts to abduct arm past 90 degrees. Also c/o R dorsal thumb pain, has been taking ibuprofen with some relief.  Review of Systems:  Review of Systems  Patient Active Problem List   Diagnosis Date Noted  . H/O transient cerebral ischemia 05/26/2014  . Hoarse 05/26/2014  . 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 Tendinitis of wrist 05/26/2014    Prior to Admission medications   Not on File    Allergies  Allergen Reactions  . Molds & Smuts Other (See Comments)    sneezing    History reviewed. No pertinent past surgical history.  Social History  Substance Use Topics  . Smoking status: Former Smoker -- 1.00 packs/day for 10 years    Quit date: 02/04/1984  . Smokeless tobacco: Never Used  . Alcohol Use: No    Family History  Problem Relation Age of Onset  . Hyperlipidemia Mother   . Heart attack Father     Medication list has been reviewed and updated.  Physical Examination: BP 110/78 mmHg  Pulse 68  Ht 5\' 4"  (1.626 m)  Wt 135 lb (61.236 kg)  BMI 23.16 kg/m2  LMP 02/03/2013  Physical Exam  Constitutional: She appears well-developed and well-nourished.  Musculoskeletal:  Full ROM R shoulder but hurts to abduct past 90 degrees Tender over superior rotator cuff OA changes B thumb MCP Tender over R 1st metacarpal  Nursing note and vitals reviewed.   Assessment and Plan:  1. Rotator cuff tendonitis, right - Ambulatory referral to Physical Therapy  2. Thumb tendonitis - Ambulatory referral to Physical Therapy  3. Need for influenza vaccination - Flu Vaccine QUAD 36+ mos PF IM (Fluarix & Fluzone Quad PF)  Return if symptoms worsen or fail to improve.  Dionne AnoWilliam M. Kingsley SpittlePlonk, Jr.  MD [facility]  01/11/2015

## 2015-01-24 ENCOUNTER — Ambulatory Visit: Payer: Federal, State, Local not specified - PPO | Attending: Family Medicine | Admitting: Physical Therapy

## 2015-01-24 DIAGNOSIS — M79644 Pain in right finger(s): Secondary | ICD-10-CM | POA: Insufficient documentation

## 2015-01-24 DIAGNOSIS — M25611 Stiffness of right shoulder, not elsewhere classified: Secondary | ICD-10-CM | POA: Diagnosis present

## 2015-01-24 DIAGNOSIS — M25511 Pain in right shoulder: Secondary | ICD-10-CM | POA: Diagnosis not present

## 2015-01-25 ENCOUNTER — Encounter: Payer: Self-pay | Admitting: Physical Therapy

## 2015-01-25 NOTE — Therapy (Signed)
[facility] [address] [place], [place], [address] Phone: [phone]   Fax:  [phone]  Physical Therapy Evaluation  Patient Details  Name: Kathy Kennedy MRN: [id number] Date of Birth: Oct 28, 1955 Referring Provider: Dr. Hollace Hayward  Encounter Date: 01/24/2015      PT End of Session - 01/25/15 1351    Visit Number 1   Number of Visits 8   Date for PT Re-Evaluation 02/21/15   PT Start Time 0817   PT Stop Time 0900   PT Time Calculation (min) 43 min   Activity Tolerance Patient tolerated treatment well;Patient limited by pain   Behavior During Therapy [facility] for tasks assessed/performed      Past Medical History  Diagnosis Date  . Stroke [facility])     3 years ago (mini stroke)    History reviewed. No pertinent past surgical history.  There were no vitals filed for this visit.  Visit Diagnosis:  Pain in joint of right shoulder  Thumb pain, right  Stiffness of joint, shoulder region, right      Subjective Assessment - 01/25/15 1346    Subjective Pt reports R shoulder pain at a 2/10 at rest and states it can get as high as a 7/10. 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 Pt states she fell in July of 2016 and has had shoulder pain since then. Pt reports difficulty with opening a jar due to R thumb pain. Pt reports having a similar pain in the past in her L thumb. Pt states that her pain in her shoulder is waking her up at night.   Limitations Lifting;Writing;Other (comment)  carrying/gripping mail and packages   Patient Stated Goals work without pain/use her arm properly   Currently in Pain? Yes   Pain Score 2    Pain Location Shoulder   Pain Orientation Right   Pain Descriptors / Indicators Dull;Aching;Sharp;Shooting   Pain Type Chronic pain   Pain Onset More than a month ago   Pain Frequency Intermittent   Aggravating Factors  abduction/mail  delivery/gripping            [facility] PT Assessment - 01/25/15 0001    Assessment   Medical Diagnosis Rotator cuff tendonitis, R; thumb tendonitis   Referring Provider Dr. Hollace Hayward   Onset Date/Surgical Date 08/05/14   Hand Dominance Left   Prior Therapy none       OBJECTIVE: Manual: AP, PA, inferior GH joint mobilizations grade II 20 seconds x 4 each direction. Increased complaints of pain with AP and inferior joint mobilizations. There ex: pt demonstrated proper pulley form (issued for HEP).        PT Education - 01/25/15 1350    Education provided Yes   Education Details Pt given pullys in flexion to encourage AAROM in pain free tolerable range.   Person(s) Educated Patient   Methods Explanation;Demonstration;Handout;Verbal cues   Comprehension Returned demonstration;Verbalized understanding             PT Long Term Goals - 01/25/15 1409    PT LONG TERM GOAL #1   Title Pt will be independent with HEP in order to increase R shoulder AROM to WNL as compared to L shoulder AROM to improve mobility.   Baseline R shoulder flexion 124 deg; abduction 81 deg; ER 31 deg.   Time 4   Period Weeks   Status New   PT LONG TERM  GOAL #2   Title Pt. will demonstrate a 10% increase in L grip strength as compared to R with no increase c/o thumb pain to carry/lift boxes at work.     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   Time 4   Period Weeks   Status New   PT LONG TERM GOAL #5   Title Pt will increase R shoulder muscle strength by 1/2 MMT in order to regain functional mobility without increased pain.   Time 4   Period Weeks   Status New             Plan - 01/25/15 1352    Clinical Impression Statement Pt is a 59 y.o. F referred to PT for R thumb tendonitis and R shoulder pain. Pt reports decreased gripping abilities with R thumb. Pt states that her R shoulder pain is a 2/10 at rest. Pt is left handed. Pt's MMT is L UE 4+/5 and R UE 4-/5 except R wrist flexion/extension is 3/5. Pt AROM: L shoulder flexion 151 deg in sitting; L shoulder abduction 146 in sitting; ER in supine is 59 deg; IR in supine is WFL. R shoulder flexion 124 deg in sitting pain limited by a dull ache; R shoulder abduction 81 deg in sitting with a sharp pain limiting range; ER 31 deg in supine with pain; IR WNL. Pt's GH joint is hypomobile with PA, AP and inferior glides grade II and painful.  Pt has pain with PROM in supine for abduction and ER. Pt will benefit from short term skilled PT in order to regain motion, decrease pain and return to pain free lifestyle.  QUICK DASH: 31.8%   Pt will benefit from skilled therapeutic intervention in order to improve on the following deficits Decreased activity tolerance;Decreased strength;Decreased mobility;Hypomobility;Impaired flexibility;Improper body mechanics;Postural dysfunction;Pain;Impaired UE functional use;Decreased range of motion;Decreased endurance   Rehab Potential Good   PT Frequency 2x / week   PT Duration 4 weeks   PT Treatment/Interventions ADLs/Self Care Home Management;Electrical Stimulation;Cryotherapy;Moist Heat;Therapeutic exercise;Therapeutic activities;Manual techniques;Functional mobility training;Patient/family education;Neuromuscular re-education;Passive range of motion;Dry needling   PT Next Visit Plan joint mobility/AAROM/scapular strengthening/assess scapular mobility   PT Home Exercise Plan see handout   Consulted and Agree with Plan of Care Patient         Problem List Patient Active Problem List   Diagnosis Date Noted  . H/O transient cerebral ischemia 05/26/2014  . Hoarse 05/26/2014  .  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 Tendinitis of wrist 05/26/2014   Cammie McgeeMichael C Sherk, PT, DPT # [id number]   01/25/2015, 2:26 PM  [facility] [address] [place], [place], [address] Phone: [phone]   Fax:  [phone]  Name: Kathy Kennedy MRN: [id number] Date of Birth: 1956/01/18

## 2015-01-26 ENCOUNTER — Ambulatory Visit: Payer: Federal, State, Local not specified - PPO | Admitting: Physical Therapy

## 2015-01-26 ENCOUNTER — Encounter: Payer: Self-pay | Admitting: Physical Therapy

## 2015-01-26 DIAGNOSIS — M25611 Stiffness of right shoulder, not elsewhere classified: Secondary | ICD-10-CM

## 2015-01-26 DIAGNOSIS — M25511 Pain in right shoulder: Secondary | ICD-10-CM | POA: Diagnosis not present

## 2015-01-26 DIAGNOSIS — M79644 Pain in right finger(s): Secondary | ICD-10-CM

## 2015-01-26 NOTE — Therapy (Signed)
[place] [facility] [address] [place], [place], [address] Phone: [phone]   Fax:  [phone]  Physical Therapy Treatment  Patient Details  Name: Kathy Kennedy MRN: [id number] Date of Birth: Sep 03, 1955 Referring Provider: Dr. Hollace HaywardPlonk  Encounter Date: 01/26/2015      PT End of Session - 01/26/15 1251    Visit Number 2   Number of Visits 8   Date for PT Re-Evaluation 02/21/15   PT Start Time 0738   PT Stop Time 0805   PT Time Calculation (min) 27 min   Activity Tolerance Patient tolerated treatment well;No increased pain   Behavior [facility] for tasks assessed/performed      Past Medical History  Diagnosis Date  . Stroke [facility](HCC)     3 years ago (mini stroke)    History reviewed. No pertinent past surgical history.  There were no vitals filed for this visit.  Visit Diagnosis:  Pain in joint of right shoulder  Thumb pain, right  Stiffness of joint, shoulder region, right      Subjective Assessment - 01/26/15 1249    Subjective Pt reports her R shoulder and thumb are feeling ok today.    Limitations Lifting;Writing;Other (comment)  carrying/gripping mail and packages   Patient Stated Goals work without pain/use her arm properly   Currently in Pain? Yes   Pain Score 2    Pain Location Shoulder   Pain Orientation Right   Pain Onset More than a month ago       OBJECTIVE: Manual: In supine, grade III PA and AP GH joint mobilizations 30 seconds x 5 each direction. Pt demonstrates hypomobility but no reports of symptoms. In supine, PROM in flexion/abduction/ER x 20 each direction. Pt has difficulty relaxing and assist with PROM without verbal cues.  Pt response to tx for medical necessity: Pt benefits from manual to increase pain free ROM to allow increased R UE functional use.        PT Education - 01/26/15 1444    Education provided Yes   Education Details Thumb splint   Person(s) Educated Patient   Methods Explanation;Handout   Comprehension Verbalized understanding             PT Long Term Goals - 01/25/15 1409    PT LONG TERM GOAL #1   Title Pt will be independent with HEP in order to increase R shoulder AROM to WNL as compared to L shoulder AROM to improve mobility.   Baseline R shoulder flexion 124 deg; abduction 81 deg; ER 31 deg.   Time 4   Period Weeks   Status New   PT LONG TERM GOAL #2   Title Pt. will demonstrate a 10% increase in L grip strength as compared to R with no increase c/o thumb pain to carry/lift boxes at work.     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   Time 4   Period Weeks   Status New   PT LONG TERM GOAL #5  Title Pt will increase R shoulder muscle strength by 1/2 MMT in order to regain functional mobility without increased pain.   Time 4   Period Weeks   Status New            Plan - 01/26/15 1252    Clinical Impression Statement Pt reports pain with endrange PROM in abduction and flexion. Pt reports no symptoms in ER. Pt has difficulty relaxing with PROM and tends to facilate movement. Pt has hypomobile PA and AP GH joint grade III mobilization. Pt reports no pain with grade III mobilization today.   Pt will benefit from skilled therapeutic intervention in order to improve on the following deficits Decreased activity tolerance;Decreased strength;Decreased mobility;Hypomobility;Impaired flexibility;Improper body mechanics;Postural dysfunction;Pain;Impaired UE functional use;Decreased range of motion;Decreased endurance   Rehab Potential Good   PT Frequency 2x / week   PT Duration 4 weeks   PT  Treatment/Interventions ADLs/Self Care Home Management;Electrical Stimulation;Cryotherapy;Moist Heat;Therapeutic exercise;Therapeutic activities;Manual techniques;Functional mobility training;Patient/family education;Neuromuscular re-education;Passive range of motion;Dry needling   PT Next Visit Plan joint mobility/AAROM/scapular strengthening/assess scapular mobility   PT Home Exercise Plan see handout   Consulted and Agree with Plan of Care Patient        Problem List Patient Active Problem List   Diagnosis Date Noted  . H/O transient cerebral ischemia 05/26/2014  . Hoarse 05/26/2014  . 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 Tendinitis of wrist 05/26/2014   Cammie Mcgee, PT, DPT # [phone]   01/26/2015, 2:47 PM  [place] [facility] [address] [place], [place], [address] Phone: [phone]   Fax:  [phone]  Name: Kathy Kennedy MRN: [id number] Date of Birth: 1955-07-28

## 2015-01-31 ENCOUNTER — Encounter: Payer: Self-pay | Admitting: Physical Therapy

## 2015-01-31 ENCOUNTER — Ambulatory Visit: Payer: Federal, State, Local not specified - PPO | Admitting: Physical Therapy

## 2015-01-31 DIAGNOSIS — M25511 Pain in right shoulder: Secondary | ICD-10-CM | POA: Diagnosis not present

## 2015-01-31 DIAGNOSIS — M25611 Stiffness of right shoulder, not elsewhere classified: Secondary | ICD-10-CM

## 2015-01-31 DIAGNOSIS — M79644 Pain in right finger(s): Secondary | ICD-10-CM

## 2015-01-31 NOTE — Therapy (Signed)
[facility] [address] [place], [place], [address] Phone: [phone]   Fax:  [phone]  Physical Therapy Treatment  Patient Details  Name: Kathy RobinsBelinda L Kennedy MRN: [id number] Date of Birth: November 17, 1955 Referring Provider: Dr. Hollace HaywardPlonk  Encounter Date: 01/31/2015      PT End of Session - 01/31/15 1401    Visit Number 3   Number of Visits 8   Date for PT Re-Evaluation 02/21/15   PT Start Time 0731   PT Stop Time 0803   PT Time Calculation (min) 32 min   Activity Tolerance Patient tolerated treatment well;No increased pain   Behavior During Therapy [facility] for tasks assessed/performed      Past Medical History  Diagnosis Date  . Stroke [facility](HCC)     3 years ago (mini stroke)    History reviewed. No pertinent past surgical history.  There were no vitals filed for this visit.  Visit Diagnosis:  Pain in joint of right shoulder  Thumb pain, right  Stiffness of joint, shoulder region, right      Subjective Assessment - 01/31/15 1400    Subjective Pt. states she had a good weekend and bought a thumb splint to use at work/ with household tasks.  Pt. reports 4/10 R shoulder pain with overhead reaching today.    Limitations Lifting;Writing;Other (comment)   Patient Stated Goals work without pain/use her arm properly   Currently in Pain? Yes   Pain Score 4    Pain Location Shoulder   Pain Orientation Right      OBJECTIVE: Manual: In supine, grade III PA and AP GH joint mobilizations 30 seconds x 5 each direction.  R shoulder LAD at 45 deg. Abd. Position 5x (no pain). Decrease hypomobility with PA mobs. With minimal c/o R shoulder pain.  In supine, AA/PROM in flexion/abduction/ER x 20 each direction. Pt has difficulty relaxing and assist with PROM without verbal cues.  Reviewed HEP.  STM to R UT/ posterior deltoid/ proximal biceps on R UE after tx.  PT recommended icing but tx limited by [place]   Pt response to tx for medical necessity: Pt benefits from manual to increase pain free ROM to allow increased R UE functional use.         PT Long Term Goals - 01/25/15 1409    PT LONG TERM GOAL #1   Title Pt will be independent with HEP in order to increase R shoulder AROM to WNL as compared to L shoulder AROM to improve mobility.   Baseline R shoulder flexion 124 deg; abduction 81 deg; ER 31 deg.   Time 4   Period Weeks   Status New   PT LONG TERM GOAL #2   Title Pt. will demonstrate a 10% increase in L grip strength as compared to R with no increase c/o thumb pain to carry/lift boxes at work.     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   Time 4   Period Weeks  Status New   PT LONG TERM GOAL #5   Title Pt will increase R shoulder muscle strength by 1/2 MMT in order to regain functional mobility without increased pain.   Time 4   Period Weeks   Status New            Plan - 01/31/15 1409    Clinical Impression Statement Pt. presents with increase R shoulder flexion/ abd. after manual tx. in pain tolerable range.  R shoulder abduction remains priimary pain limited movement.  Marked increase in R shoulder AROM (supine position) as compared to initial evaluation:  flexion 144 deg., abd. 125 deg., ER 76 deg.  Good PA R shoulder mobility with minimal to no tenderness with posterior deltoid/ AC joint tenderness with palpation.  PT to progress shoulder ROM/ strengthening next tx. session.      Pt will benefit from skilled therapeutic intervention in order to improve on the following deficits Decreased activity tolerance;Decreased  strength;Decreased mobility;Hypomobility;Impaired flexibility;Improper body mechanics;Postural dysfunction;Pain;Impaired UE functional use;Decreased range of motion;Decreased endurance   Rehab Potential Good   PT Frequency 2x / week   PT Duration 4 weeks   PT Next Visit Plan joint mobility/AAROM/scapular strengthening/ issue new HEP   PT Home Exercise Plan see handout        Problem List Patient Active Problem List   Diagnosis Date Noted  . H/O transient cerebral ischemia 05/26/2014  . Hoarse 05/26/2014  . 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 Tendinitis of wrist 05/26/2014   Cammie Mcgee, PT, DPT # [phone]   01/31/2015, 2:23 PM  [facility] [address] [place], [place], [address] Phone: [phone]   Fax:  [phone]  Name: Kathy Kennedy MRN: [id number] Date of Birth: October 17, 1955

## 2015-02-01 ENCOUNTER — Encounter: Payer: Federal, State, Local not specified - PPO | Admitting: Physical Therapy

## 2015-02-02 ENCOUNTER — Ambulatory Visit: Payer: Federal, State, Local not specified - PPO | Admitting: Physical Therapy

## 2015-02-02 DIAGNOSIS — M25611 Stiffness of right shoulder, not elsewhere classified: Secondary | ICD-10-CM

## 2015-02-02 DIAGNOSIS — M25511 Pain in right shoulder: Secondary | ICD-10-CM

## 2015-02-02 DIAGNOSIS — M79644 Pain in right finger(s): Secondary | ICD-10-CM

## 2015-02-03 NOTE — Therapy (Signed)
[facility] [address] [place], [place], [address] Phone: [phone]   Fax:  [phone]  Physical Therapy Treatment  Patient Details  Name: Kathy Kennedy MRN: [id number] Date of Birth: [birth year] Referring Provider: Dr. Hollace Hayward  Encounter Date: 02/02/2015      PT End of Session - 02/03/15 1406    Visit Number 4   Number of Visits 8   Date for PT Re-Evaluation 02/21/15   PT Start Time 0728   PT Stop Time 0800   PT Time Calculation (min) 32 min   Activity Tolerance Patient tolerated treatment well;No increased pain   Behavior During Therapy [facility] for tasks assessed/performed      Past Medical History  Diagnosis Date  . Stroke [facility])     3 years ago (mini stroke)    No past surgical history on file.  There were no vitals filed for this visit.  Visit Diagnosis:  Pain in joint of right shoulder  Thumb pain, right  Stiffness of joint, shoulder region, right      Subjective Assessment - 02/03/15 1403    Subjective Pt. reports she has less shoulder pain since PT tx. on Tuesday and remains compliant with HEP.  Pt. was off work yesterday and is off the next 4 days to go to [place].     Limitations Lifting;Writing;Other (comment)   Currently in Pain? Yes   Pain Score 2    Pain Location Shoulder   Pain Orientation Right        OBJECTIVE:  Manual: supine R shoulder AA/PROM all planes to pain tolerable stretch/ static holds 5x each.  R shoulder AP/PA/inf. Grade II-III Mobs. 2x20 sec. (no pain).  STM to R shoulder/ AC joint region (tenderness reported).  There.ex.: See HEP (RTB scap. Retraction/ shoulder flexion with horizontal focus/ tricep extension/ diagonals)- 20x each.  Discussed importance of ice after strengthening/ work.     Pt response for medical necessity:  No increase c/o pain with manual tx./ addition of stability ex. Pt. Will benefit from more progressive scapular/ shoulder strengthening to improve pain-free  mobility at work.         PT Long Term Goals - 01/25/15 1409    PT LONG TERM GOAL #1   Title Pt will be independent with HEP in order to increase R shoulder AROM to WNL as compared to L shoulder AROM to improve mobility.   Baseline R shoulder flexion 124 deg; abduction 81 deg; ER 31 deg.   Time 4   Period Weeks   Status New   PT LONG TERM GOAL #2   Title Pt. will demonstrate a 10% increase in L grip strength as compared to R with no increase c/o thumb pain to carry/lift boxes at work.     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   Time 4   Period Weeks   Status New   PT LONG TERM GOAL #5   Title Pt will increase R shoulder muscle strength by 1/2 MMT in  order to regain functional mobility without increased pain.   Time 4   Period Weeks   Status New               Plan - 02/03/15 1407    Clinical Impression Statement Good technique with HEP for shoulder/ postural strengthening.  No increase c/o R shoulder pain with resisted ex.  Pt. demonstrates full R shoulder flexion in supine position prior to manual/capsular mobility.  Minimal pain with full R shoulder PROM all planes.  Pt. will benefit from shoulder stability ex. at this time with progressive HEP.     Pt will benefit from skilled therapeutic intervention in order to improve on the following deficits Decreased activity tolerance;Decreased strength;Decreased mobility;Hypomobility;Impaired flexibility;Improper body mechanics;Postural dysfunction;Pain;Impaired UE functional use;Decreased range of motion;Decreased endurance   Rehab Potential Good   PT Frequency 2x / week   PT Duration 4 weeks   PT  Treatment/Interventions ADLs/Self Care Home Management;Electrical Stimulation;Cryotherapy;Moist Heat;Therapeutic exercise;Therapeutic activities;Manual techniques;Functional mobility training;Patient/family education;Neuromuscular re-education;Passive range of motion;Dry needling   PT Next Visit Plan R shoulder stability ex. progression/ pain mgmt.    PT Home Exercise Plan see handout   Consulted and Agree with Plan of Care Patient        Problem List Patient Active Problem List   Diagnosis Date Noted  . H/O transient cerebral ischemia 05/26/2014  . Hoarse 05/26/2014  . 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 Tendinitis of wrist 05/26/2014   Kathy Kennedy, PT, DPT # [id number]   02/03/2015, 2:12 PM  [facility] [address] [place], [place], [address] Phone: [phone]   Fax:  [phone]  Name: Kathy RobinsBelinda L Ostrow MRN: [id number] Date of Birth: [birth year]

## 2015-02-07 ENCOUNTER — Ambulatory Visit: Payer: Federal, State, Local not specified - PPO | Attending: Family Medicine | Admitting: Physical Therapy

## 2015-02-07 ENCOUNTER — Encounter: Payer: Self-pay | Admitting: Physical Therapy

## 2015-02-07 DIAGNOSIS — M79644 Pain in right finger(s): Secondary | ICD-10-CM | POA: Insufficient documentation

## 2015-02-07 DIAGNOSIS — M25511 Pain in right shoulder: Secondary | ICD-10-CM | POA: Diagnosis not present

## 2015-02-07 DIAGNOSIS — M25611 Stiffness of right shoulder, not elsewhere classified: Secondary | ICD-10-CM | POA: Diagnosis present

## 2015-02-07 NOTE — Therapy (Signed)
[facility] [address] [place], [place], [address] Phone: [phone]   Fax:  [phone]  Physical Therapy Treatment  Patient Details  Name: Cherre RobinsBelinda L Clair MRN: [id number] Date of Birth: 1956/01/02 Referring Provider: Dr. Hollace HaywardPlonk  Encounter Date: 02/07/2015      PT End of Session - 02/07/15 0851    Visit Number 5   Number of Visits 8   Date for PT Re-Evaluation 02/21/15   PT Start Time 0733   PT Stop Time 0800   PT Time Calculation (min) 27 min   Activity Tolerance Patient tolerated treatment well;No increased pain;Patient limited by pain   Behavior During Therapy [facility] for tasks assessed/performed      Past Medical History  Diagnosis Date  . Stroke [facility](HCC)     3 years ago (mini stroke)    History reviewed. No pertinent past surgical history.  There were no vitals filed for this visit.  Visit Diagnosis:  Pain in joint of right shoulder  Thumb pain, right  Stiffness of joint, shoulder region, right      Subjective Assessment - 02/07/15 0850    Subjective Pt reports having a nice vacation and being tired. Pt states she is having more shoulder pain than last session.    Limitations Lifting;Writing;Other (comment)   Currently in Pain? Yes   Pain Score 2    Pain Location Shoulder   Pain Orientation Right      OBJECTIVE: There ex: Nautilus: lat pull down/scapular retraction  X 20 with 30#. Standing: bicep/tricep/shoulder adduction/shoulder flexion x 20 with 3#. Manual: In supine, PROM/stretching. AP and PA grade III mobilizations 30 seconds x 4 each direction. Pt reports discomfort with PA mobilizations.  Pt response to tx for medical necessity: Pt benefits from strengthening and manual therapy to promote pain free ROM.        PT Long Term Goals - 01/25/15 1409    PT LONG TERM GOAL #1   Title Pt will be independent with HEP in order to increase R shoulder AROM to WNL as compared to L shoulder AROM to improve  mobility.   Baseline R shoulder flexion 124 deg; abduction 81 deg; ER 31 deg.   Time 4   Period Weeks   Status New   PT LONG TERM GOAL #2   Title Pt. will demonstrate a 10% increase in L grip strength as compared to R with no increase c/o thumb pain to carry/lift boxes at work.     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   Time 4   Period Weeks   Status New   PT LONG TERM GOAL #5   Title Pt will increase R shoulder muscle strength by 1/2 MMT in order to regain functional mobility without increased pain.   Time 4   Period Weeks   Status New            Plan - 02/07/15 [id number]    Clinical Impression Statement Pt demonstrates poor shoulder muscle eccentric control with weighted  Nautilus activities. Pt reports an increase in R shoulder pain with horizontal adduction. Pt demonstrates good capsular mobility in the AP direction. Pt is hypomobile in the PA direction. Pt benefits from strengthening and manual therapy to increase pain free ROM.   Pt will benefit from skilled therapeutic intervention in order to improve on the following deficits Decreased activity tolerance;Decreased strength;Decreased mobility;Hypomobility;Impaired flexibility;Improper body mechanics;Postural dysfunction;Pain;Impaired UE functional use;Decreased range of motion;Decreased endurance   Rehab Potential Good   PT Frequency 2x / week   PT Duration 4 weeks   PT Treatment/Interventions ADLs/Self Care Home Management;Electrical Stimulation;Cryotherapy;Moist Heat;Therapeutic exercise;Therapeutic activities;Manual techniques;Functional mobility training;Patient/family education;Neuromuscular  re-education;Passive range of motion;Dry needling   PT Next Visit Plan Scapular stabilization/strengthening/manual   PT Home Exercise Plan continue current plan re-educated on importance of ice   Consulted and Agree with Plan of Care Patient        Problem List Patient Active Problem List   Diagnosis Date Noted  . H/O transient cerebral ischemia 05/26/2014  . Hoarse 05/26/2014  . 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 Tendinitis of wrist 05/26/2014    Krista Blue, SPT 02/07/2015, 8:56 AM  [facility] [address] [place], [place], [address] Phone: [phone]   Fax:  [phone]  Name: LONDEN LORGE MRN: [id number] Date of Birth: 12/01/1955

## 2015-02-09 ENCOUNTER — Ambulatory Visit: Payer: Federal, State, Local not specified - PPO | Admitting: Physical Therapy

## 2015-02-09 DIAGNOSIS — M25611 Stiffness of right shoulder, not elsewhere classified: Secondary | ICD-10-CM

## 2015-02-09 DIAGNOSIS — M25511 Pain in right shoulder: Secondary | ICD-10-CM | POA: Diagnosis not present

## 2015-02-09 DIAGNOSIS — M79644 Pain in right finger(s): Secondary | ICD-10-CM

## 2015-02-10 NOTE — Therapy (Addendum)
[facility] [address] [place], [place], [address] Phone: [phone]   Fax:  [phone]  Physical Therapy Treatment  Patient Details  Name: Kathy Kennedy MRN: [id number] Date of Birth: 1956/01/29 Referring Provider: Dr. Hollace Hayward  Encounter Date: 02/09/2015    Past Medical History  Diagnosis Date  . Stroke [facility])     3 years ago (mini stroke)    No past surgical history on file.  There were no vitals filed for this visit.  Visit Diagnosis:  Pain in joint of right shoulder  Thumb pain, right  Stiffness of joint, shoulder region, right      OBJECTIVE: There ex: Nautilus: lat pull down/scapular retraction X 20 with 40#. Standing: bicep/tricep/shoulder adduction/shoulder flexion x 20 with 3#. Manual: In supine, AA/PROM/stretching. AP and PA grade III mobilizations 30 seconds x 4 each direction. STM to R shoulder/ UT region during pt. AROM of R shoulder (all planes).  Pt response to tx for medical necessity: Pt benefits from strengthening and manual therapy to promote pain free ROM.  No R thumb pain and pt. Reports feeling better after tx.        No c/o R thumb pain during ther.ex./ grasping of bar during UE Nautilus ex.  Full R shoulder AROM all planes with minimal discomfort at R anterior deltoid and AC joint,  Minimal R shoulder tenderness wtih palpation during R shoulder mobs./ manual stretching of deltoid/ pec musculature.          PT Long Term Goals - 01/25/15 1409    PT LONG TERM GOAL #1   Title Pt will be independent with HEP in order to increase R shoulder AROM to WNL as compared to L shoulder AROM to improve mobility.   Baseline R shoulder flexion 124 deg; abduction 81 deg; ER 31 deg.   Time 4   Period Weeks   Status New   PT LONG TERM GOAL #2   Title Pt. will demonstrate a 10% increase in L grip strength as compared to R with no increase c/o thumb pain to carry/lift boxes at work.     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   Time 4   Period Weeks   Status New   PT LONG TERM GOAL #5   Title Pt will increase R shoulder muscle strength by 1/2 MMT in order to regain functional mobility without increased pain.   Time 4   Period Weeks   Status New       Pt. reports she is doing better in shoulder and able to sleep better at night for 1st time in awhile.      Problem List Patient Active Problem List   Diagnosis Date Noted  . H/O transient cerebral ischemia 05/26/2014  . Hoarse 05/26/2014  . 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 Tendinitis of wrist 05/26/2014   Cammie Mcgee, PT, DPT # [phone]   02/10/2015, 1:23 PM   [facility] [address] [place], [place], [address] Phone: [phone]   Fax:  [phone]  Name: Kathy Kennedy MRN: [id number] Date of Birth: Aug 20, 1955

## 2015-02-14 ENCOUNTER — Encounter: Payer: Federal, State, Local not specified - PPO | Admitting: Physical Therapy

## 2015-02-16 ENCOUNTER — Ambulatory Visit: Payer: Federal, State, Local not specified - PPO | Admitting: Physical Therapy

## 2015-02-16 DIAGNOSIS — M79644 Pain in right finger(s): Secondary | ICD-10-CM

## 2015-02-16 DIAGNOSIS — M25511 Pain in right shoulder: Secondary | ICD-10-CM

## 2015-02-16 DIAGNOSIS — M25611 Stiffness of right shoulder, not elsewhere classified: Secondary | ICD-10-CM

## 2015-02-17 ENCOUNTER — Encounter: Payer: Self-pay | Admitting: Physical Therapy

## 2015-02-17 NOTE — Therapy (Addendum)
[place] [address] [place], [place], [address] Phone: [phone]   Fax:  [phone]  Physical Therapy Treatment  Patient Details  Name: Kathy Kennedy MRN: [id number] Date of Birth: 07-02-1955 Referring Provider: Dr. Hollace HaywardPlonk  Encounter Date: 02/16/2015      PT End of Session - 02/17/15 1402    Visit Number 6   Number of Visits 8   Date for PT Re-Evaluation 02/21/15   PT Start Time 0729   PT Stop Time 0801   PT Time Calculation (min) 32 min   Activity Tolerance Patient tolerated treatment well;No increased pain   Behavior During Therapy [facility] for tasks assessed/performed      Past Medical History  Diagnosis Date  . Stroke [facility](HCC)     3 years ago (mini stroke)    History reviewed. No pertinent past surgical history.  There were no vitals filed for this visit.  Visit Diagnosis:  Pain in joint of right shoulder  Thumb pain, right  Stiffness of joint, shoulder region, right      Subjective Assessment - 02/17/15 1400    Subjective Pt. had a busy week at work and states her shoulder is doing better but not 100%.  Pt. reports occasionally wearing thumb splint at work and thumb is doing better.  Pt. reports 2-3/10 R shoulder discomofort.     Limitations Lifting;Writing;Other (comment)   Patient Stated Goals work without pain/use her arm properly   Currently in Pain? Yes   Pain Score 2    Pain Location Shoulder   Pain Orientation Right       OBJECTIVE: There ex: Nautilus: lat pull down/scapular retractionX 20 with 40#/ tricep ext. 30#/ 10# bicep curls 20x each. Standing: shoulder adduction/shoulder flexion x 20 with 3#.  Supine 2#/3# serratus punches 10x2 on R.  Bodyblade ex. At mirror (B UE assist for improved control/ endurance).  Manual: In supine, AA/PROM/stretching. AP and PA grade III mobilizations 30 seconds x 4 each direction. STM to R shoulder/ UT region during pt. AROM of R shoulder (all planes).   Pt response to tx for medical necessity: Pt benefits from strengthening and manual therapy to promote pain free ROM. No R thumb pain and pt. Reports feeling better after tx.      Pt. continues to progress with pain-free R shoulder AROM and stability of shoulder/ scapular musculature.  No palpable tenderness or trigger points noted during STM to R deltoid region/ proximal biceps.  Probable discharge to independent HEP next tx. session if no issues this weekend.            PT Long Term Goals - 01/25/15 1409    PT LONG TERM GOAL #1   Title Pt will be independent with HEP in order to increase R shoulder AROM to WNL as compared to L shoulder AROM to improve mobility.   Baseline R shoulder flexion 124 deg; abduction 81 deg; ER 31 deg.   Time 4   Period Weeks   Status New   PT LONG TERM GOAL #2   Title Pt. will demonstrate a 10% increase in L grip strength as compared to R with no increase c/o thumb pain to carry/lift boxes at work.     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   Time 4   Period Weeks   Status New   PT LONG TERM GOAL #5   Title Pt will increase R shoulder muscle strength by 1/2 MMT in order to regain functional mobility without increased pain.   Time 4   Period Weeks   Status New               Plan - 02/17/15 0850    Clinical Impression Statement No c/o R thumb pain during ther.ex./ grasping of bar during UE Nautilus ex.  Full R shoulder AROM all planes with minimal discomfort at R anterior deltoid and AC joint,  Minimal R shoulder tenderness wtih palpation during R shoulder mobs./ manual stretching of deltoid/ pec  musculature.     Pt will benefit from skilled therapeutic intervention in order to improve on the following deficits Decreased activity tolerance;Decreased strength;Decreased mobility;Hypomobility;Impaired flexibility;Improper body mechanics;Postural dysfunction;Pain;Impaired UE functional use;Decreased range of motion;Decreased endurance   Rehab Potential Good   PT Frequency 2x / week   PT Duration 4 weeks   PT Treatment/Interventions ADLs/Self Care Home Management;Electrical Stimulation;Cryotherapy;Moist Heat;Therapeutic exercise;Therapeutic activities;Manual techniques;Functional mobility training;Patient/family education;Neuromuscular re-education;Passive range of motion;Dry needling   PT Next Visit Plan Scapular stabilization/strengthening/manual   PT Home Exercise Plan continue current plan re-educated on importance of ice        Problem List Patient Active Problem List   Diagnosis Date Noted  . H/O transient cerebral ischemia 05/26/2014  . Hoarse 05/26/2014  . 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 Tendinitis of wrist 05/26/2014   Cammie Mcgee, PT, DPT # [phone]   02/17/2015, 4:14 PM  [facility] [address] [place], [place], [address] Phone: [phone]   Fax:  [phone]  Name: Kathy Kennedy MRN: [id number] Date of Birth: May 15, 1955

## 2015-02-21 ENCOUNTER — Ambulatory Visit: Payer: Federal, State, Local not specified - PPO | Admitting: Physical Therapy

## 2015-02-21 DIAGNOSIS — M25611 Stiffness of right shoulder, not elsewhere classified: Secondary | ICD-10-CM

## 2015-02-21 DIAGNOSIS — M79644 Pain in right finger(s): Secondary | ICD-10-CM

## 2015-02-21 DIAGNOSIS — M25511 Pain in right shoulder: Secondary | ICD-10-CM

## 2015-02-21 NOTE — Therapy (Signed)
[place] [facility] [address] [place], [place], [address] Phone: [phone]   Fax:  [phone]  Physical Therapy Treatment  Patient Details  Name: Kathy Kennedy MRN: [id number] Date of Birth: 19-Aug-1955 Referring Provider: Dr. Vicente Masson  Encounter Date: 02/21/2015      PT End of Session - 02/21/15 0836    Visit Number 7   Number of Visits 8   Date for PT Re-Evaluation 02/21/15   PT Start Time 0731   PT Stop Time 0802   PT Time Calculation (min) 31 min   Activity Tolerance Patient tolerated treatment well;No increased pain   Behavior During Therapy [facility] for tasks assessed/performed      Past Medical History  Diagnosis Date  . Stroke [facility])     3 years ago (mini stroke)    No past surgical history on file.  There were no vitals filed for this visit.  Visit Diagnosis:  Pain in joint of right shoulder  Thumb pain, right  Stiffness of joint, shoulder region, right      Subjective Assessment - 02/21/15 0834    Subjective Pt. states she is continuing to do well with R shoulder at work and with sleep.  No c/o R shoulder over past 1-2 weeks at night time which has allowed pt. improved rest.     Limitations Lifting;Writing;Other (comment)   Patient Stated Goals work without pain/use her arm properly   Currently in Pain? No/denies       OBJECTIVE: There ex:  Supine 2# serratus punches/ shoulder flexion (alternating)/ horizontal abd./adduction 20x bilaterally. Nautilus: 50# lat pull down/ 40# scapular retraction/ 30# tricepsX 20 each.  Reassess R MMT: flexion 4+/5, abd. 4+/5, biceps 5/5 and triceps 5/5 MMT.  Manual: In supine, AA/PROM/stretching. AP and PA grade III mobilizations 30 seconds x 4 each direction. STM to R shoulder/ UT region during pt. AROM of R shoulder (all planes).  No tenderness or pain reported.    Pt response to tx for medical necessity: Pt benefits from strengthening and manual therapy to promote  pain free ROM. No R thumb pain and pt. Reports feeling better after tx.  Discharge from PT at this time with focus on an independent gym based/ HEP.          PT Education - 02/21/15 0835    Education provided Yes   Education Details Reviewed HEP in depth.  Pt. instructed to contact PT if any further issues.  Discharge from PT at this time.     Person(s) Educated Patient   Methods Explanation;Demonstration;Handout   Comprehension Verbalized understanding             PT Long Term Goals - 02/21/15 8786    PT LONG TERM GOAL #1   Title Pt will be independent with HEP in order to increase R shoulder AROM to WNL as compared to L shoulder AROM to improve mobility.   Time 4   Period Weeks   Status Achieved   PT LONG TERM GOAL #2   Title Pt. will demonstrate a 10% increase in L grip strength as compared to R with no increase c/o thumb pain to carry/lift boxes at work.     Time 4   Period Weeks   Status Achieved   PT LONG TERM GOAL #3   Title Pt will decrease Quick DASH to less than 15% in order to open a jar without increased thumb pain.   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   Time 4   Period Weeks   Status Achieved   PT LONG TERM GOAL #5   Title Pt will increase R shoulder muscle strength by 1/2 MMT in order to regain functional mobility without increased pain.   Time 4   Period Weeks   Status Achieved            Plan - 02/21/15 0836    Clinical Impression Statement Pt. has progressed well with all aspects of R shoulder ROM/ strengthening.  Pt. will continue with independent HEP at this time.  Pt. instructed to contact PT if any further issues with R shoulder over next several weeks/month.  Pt. has met all PT goals and reports no pain/ limitations at this time.     Pt will benefit from skilled therapeutic  intervention in order to improve on the following deficits Decreased activity tolerance;Decreased strength;Decreased mobility;Hypomobility;Impaired flexibility;Improper body mechanics;Postural dysfunction;Pain;Impaired UE functional use;Decreased range of motion;Decreased endurance   Rehab Potential Good   PT Frequency 2x / week   PT Duration 4 weeks   PT Treatment/Interventions ADLs/Self Care Home Management;Electrical Stimulation;Cryotherapy;Moist Heat;Therapeutic exercise;Therapeutic activities;Manual techniques;Functional mobility training;Patient/family education;Neuromuscular re-education;Passive range of motion;Dry needling   PT Next Visit Plan Discharge   Consulted and Agree with Plan of Care Patient        Problem List Patient Active Problem List   Diagnosis Date Noted  . H/O transient cerebral ischemia 05/26/2014  . Hoarse 05/26/2014  . 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 Tendinitis of wrist 05/26/2014   Pura Spice, PT, DPT # [id number]   02/21/2015, 9:03 AM  [place] [facility] [address] [place], [place], [address] Phone: [phone]   Fax:  [phone]  Name: Kathy Kennedy MRN: [id number] Date of Birth: 1956-01-01

## 2015-02-23 ENCOUNTER — Encounter: Payer: Federal, State, Local not specified - PPO | Admitting: Physical Therapy

## 2015-08-22 DIAGNOSIS — H43811 Vitreous degeneration, right eye: Secondary | ICD-10-CM | POA: Diagnosis not present

## 2015-08-30 ENCOUNTER — Encounter: Payer: Self-pay | Admitting: Internal Medicine

## 2015-08-30 ENCOUNTER — Ambulatory Visit (INDEPENDENT_AMBULATORY_CARE_PROVIDER_SITE_OTHER): Payer: Federal, State, Local not specified - PPO | Admitting: Internal Medicine

## 2015-08-30 DIAGNOSIS — M25511 Pain in right shoulder: Secondary | ICD-10-CM

## 2015-08-30 DIAGNOSIS — G8929 Other chronic pain: Secondary | ICD-10-CM | POA: Diagnosis not present

## 2015-08-30 NOTE — Assessment & Plan Note (Signed)
Had PT - now recurrent pain and decr ROM

## 2015-08-30 NOTE — Patient Instructions (Signed)
May continue Advil 600 mg three times a day.  Continue heat.

## 2015-08-30 NOTE — Progress Notes (Signed)
    Date:  08/30/2015   Name:  Kathy Kennedy   DOB:  [birth year]   MRN:  [id number]   Chief Complaint: Shoulder Pain Started having right shoulder pain about one year ago after a fall.  She had PT for 2 months with good results.  She continued to have mild discomfort but was able to function.  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  Recently worsening pain and decreased ROM without specific injury. Previous evaluation did not include xrays or [place].  Review of Systems  Constitutional: Negative for fatigue and fever.  Respiratory: Negative for chest tightness and shortness of breath.   Cardiovascular: Negative for chest pain and palpitations.  Musculoskeletal: Positive for arthralgias and myalgias.  Neurological: Negative for weakness and numbness.    Patient Active Problem List   Diagnosis Date Noted  . H/O transient cerebral ischemia 05/26/2014  . Hoarse 05/26/2014  . 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 Tendinitis of wrist 05/26/2014    Prior to Admission medications   Not on File    Allergies  Allergen Reactions  . Molds & Smuts Other (See Comments)    sneezing    No past surgical history on file.  Social History  Substance Use Topics  . Smoking status: Former Smoker    Packs/day: 1.00    Years: 10.00    Quit date: 02/04/1984  . Smokeless tobacco: Never Used  . Alcohol use No     Medication list has been reviewed and updated.   Physical Exam  Constitutional: She is oriented to person, place, and time. She appears well-developed. No distress.  HENT:  Head: Normocephalic and atraumatic.  Cardiovascular: Normal rate, regular rhythm and normal heart sounds.   Pulmonary/Chest: Effort normal and breath sounds normal. No respiratory distress.  Musculoskeletal:       Right shoulder: She exhibits decreased range of motion and tenderness. She exhibits normal pulse and normal strength.  Abduction of right shoulder limited to 75  degrees  Neurological: She is alert and oriented to person, place, and time.  Skin: Skin is warm and dry. No rash noted.  Psychiatric: She has a normal mood and affect. Her behavior is normal. Thought content normal.  Nursing note and vitals reviewed.   BP 110/76 (BP Location: Left Arm, Patient Position: Sitting, Cuff Size: Normal)   Pulse 64   Ht 5\' 4"  (1.626 m)   Wt 134 lb 3.2 oz (60.9 kg)   LMP 02/03/2013   BMI 23.04 kg/m   Assessment and Plan: 1. Chronic right shoulder pain Continue heat and Advil but will refer for definitive diagnosis and treatment - Ambulatory referral to Orthopedic Surgery   Bari Edward, MD [facility]  08/30/2015

## 2015-09-04 DIAGNOSIS — M7541 Impingement syndrome of right shoulder: Secondary | ICD-10-CM | POA: Diagnosis not present

## 2015-09-05 ENCOUNTER — Ambulatory Visit
Admission: EM | Admit: 2015-09-05 | Discharge: 2015-09-05 | Discharge status: Against Medical Advice | Payer: Federal, State, Local not specified - PPO

## 2015-09-19 ENCOUNTER — Encounter: Payer: Self-pay | Admitting: Physical Therapy

## 2015-09-19 ENCOUNTER — Ambulatory Visit: Payer: Federal, State, Local not specified - PPO | Attending: Orthopedic Surgery | Admitting: Physical Therapy

## 2015-09-19 DIAGNOSIS — R293 Abnormal posture: Secondary | ICD-10-CM | POA: Insufficient documentation

## 2015-09-19 DIAGNOSIS — M25511 Pain in right shoulder: Secondary | ICD-10-CM | POA: Diagnosis not present

## 2015-09-19 DIAGNOSIS — M25611 Stiffness of right shoulder, not elsewhere classified: Secondary | ICD-10-CM | POA: Insufficient documentation

## 2015-09-19 NOTE — Therapy (Signed)
[place] [facility] [address] [place], [place], [address] Phone: [phone]   Fax:  [phone]  Physical Therapy Evaluation  Patient Details  Name: Kathy Kennedy MRN: [id number] Date of Birth: 05-Mar-1955 Referring Provider: Reubin MilanLaura H Berglund MD  Encounter Date: 09/19/2015      PT End of Session - 09/19/15 1112    Visit Number 1   Number of Visits 8   Date for PT Re-Evaluation 10/18/15   PT Start Time 0730   PT Stop Time 0814   PT Time Calculation (min) 44 min   Activity Tolerance Patient tolerated treatment well;Patient limited by pain   Behavior During Therapy [facility] for tasks assessed/performed      Past Medical History:  Diagnosis Date  . Stroke [facility](HCC)    3 years ago (mini stroke)    History reviewed. No pertinent surgical history.  There were no vitals filed for this visit.       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 She recieved PT in Jan 2017 for R shoulder pain and recovered to 100% of PLOF. She stopped doing her HEP and has noticed a recurrence of past shoulder pain. She experiences most of her pain in the mornings and at night before bed. Notes that she is unable to sleep on right side at the current time secondary to night pain. She has been taking a pain medication for the last two weeks which has improved sx but she cannot remember the name of it at time of appt. Pt states that occasionally she feels better after work due to increased motion of R shoulder.   Pertinent History Pt sought PT for R shoulder pain Dec-Jan of 2016/2017 with 100% improvement. States that she stopped performing HEP and sx have recurred.   Limitations Lifting;Writing;Other (comment)   Patient Stated Goals Pt would like to be able to work/sleep without shoulder pain and be able to lift/move R arm like she can L   Currently in Pain? No/denies            [facility] PT Assessment  - 09/19/15 0001      Assessment   Medical Diagnosis Chronic R Shoulder Pain   Referring Provider Reubin MilanLaura H Berglund MD   Onset Date/Surgical Date 09/05/15   Hand Dominance Left   Prior Therapy Recieved prior therapy Jan 2017      Objective:   Therapeutic Exercise: Pulleys with IR/ABD focus x30, resisted AROM with red theraband rows x10, extension with scapular retraction x10, shoulder internal rotation x10, shoulder external rotation x10.  Pt response for medical necessity: Pt presents with hypomobility of R shoulder with moderate capsular restrictions and decreased ROM and strength as compared to L shoulder. She tolerates all provided therapeutic exercise with no increase in R shoulder pain, except shoulder ER. She will benefit from skilled physical therapy to progress strengthening/mobility program so she can perform work/leisure duties pain free.       PT Education - 09/19/15 1111    Education provided Yes   Education Details Issued HEP as seen in patient instructions.   Person(s) Educated Patient   Methods Explanation;Demonstration;Handout   Comprehension Verbalized understanding             PT Long Term Goals - 09/19/15 1205      PT LONG TERM GOAL #1   Title Pt will score 0% on Quick Dash to promote self percieved  functional mobility and promote pain-free work duties/ADLs   Baseline 8/15: Quick Dash 9%   Time 4   Period Weeks   Status New     PT LONG TERM GOAL #2   Title Pt will demonstrate R UE strength gains with improvement of MMT testing by 1/2 grade to improve mobility.     Baseline R Shoulder flexion 4-/5, shoulder abduction 4-/5, ER/IR not tested formally   Time 4   Period Weeks   Status New     PT LONG TERM GOAL #3   Title Pt will be able to walk her dog using R arm with no greater than 1/10 R shoulder pain.   Baseline reports episode of 6/10 pain   Time 4   Period Weeks   Status New     PT LONG TERM GOAL #4   Title Pt will demonstrate R shoulder  ROM comparable to L ROM with <2/10 pain at end range   Baseline R/L shoulder flexion: 153/154, shoulder abd 131/150, shoulder external rotation 30/80, HBB L2/T10   Time 4   Period Weeks   Status Achieved            Plan - 09/19/15 1147    Clinical Impression Statement Pt is a pleasant 60 year old female referred to physical therapy for recurrence of R shoulder pain. She experiences majority of R shoulder pain near [facility] jt line, A/C joint will dull ache reported down across bicep; pt with very minimal tenderness to palpation. She experiences 6/10 at worst with 0/10 with rest/pain medication. She is aggravated by lifting and has majority of pain in the morning before work. Shoulder ROM: R/L flex 153/154, abd 131/150, ER 30/80, HBB: L2/T10. Passive Accessory: moderate hypomobility to AP and inferior glide. Strength MMT: R/L shoulder flexion 4- (pain limited)/4+, shoulder abd 4- (pain limited)/4+, biceps 5/5, triceps 5/5. Resisted isometric ER/IR: painless bilaterally. Posture: Patient with tendency for mild thoracic kyphosis, requires cueing for postural correction during UE AROM. Special Tests: Empty Can R: +, [place] Asal: R +. Outcome Measures: QuickDash 9     Rehab Potential Poor   Clinical Impairments Affecting Rehab Potential Positive: Prior response to physical therapy, low intensity of pain. Negative:    PT Frequency 2x / week   PT Duration 4 weeks   PT Treatment/Interventions ADLs/Self Care Home Management;Electrical Stimulation;Cryotherapy;Moist Heat;Therapeutic exercise;Therapeutic activities;Manual techniques;Functional mobility training;Patient/family education;Neuromuscular re-education;Passive range of motion;Dry needling   PT Next Visit Plan Progress strengthening plan, assess thoracic mobility   PT Home Exercise Plan see HEP in patient instructions   Consulted and Agree with Plan of Care Patient      Patient will benefit from skilled therapeutic intervention in order to  improve the following deficits and impairments:  Decreased activity tolerance, Decreased mobility, Decreased range of motion, Decreased strength, Hypomobility, Impaired UE functional use, Improper body mechanics, Postural dysfunction, Pain  Visit Diagnosis: Pain in right shoulder  Stiffness of right shoulder, not elsewhere classified  Abnormal posture     Problem List Patient Active Problem List   Diagnosis Date Noted  . Chronic right shoulder pain 08/30/2015  . H/O transient cerebral ischemia 05/26/2014  . Hoarse 05/26/2014  . 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 Tendinitis of wrist 05/26/2014   Cammie Mcgee, PT, DPT # [phone] Michaelyn Barter, SPT 09/20/2015, 7:31 AM  [place] [facility] [address] [place], [place], [address] Phone: [phone]   Fax:  [phone]  Name: Kathy Cabal  Terrilee Kennedy MRN: [id number] Date of Birth: 1955-08-19

## 2015-09-21 ENCOUNTER — Ambulatory Visit: Payer: Federal, State, Local not specified - PPO | Admitting: Physical Therapy

## 2015-09-21 DIAGNOSIS — R293 Abnormal posture: Secondary | ICD-10-CM

## 2015-09-21 DIAGNOSIS — M25611 Stiffness of right shoulder, not elsewhere classified: Secondary | ICD-10-CM

## 2015-09-21 DIAGNOSIS — M25511 Pain in right shoulder: Secondary | ICD-10-CM

## 2015-09-21 NOTE — Therapy (Signed)
[place] [facility] [address] [place], [place], [address] Phone: [phone]   Fax:  [phone]  Physical Therapy Treatment  Patient Details  Name: Kathy RobinsBelinda L Kennedy MRN: [id number] Date of Birth: 1955/06/26 Referring Provider: Reubin MilanLaura H Berglund MD  Encounter Date: 09/21/2015      PT End of Session - 09/21/15 0749    Visit Number 2   Number of Visits 8   Date for PT Re-Evaluation 10/18/15   PT Start Time 0731   PT Stop Time 0814   PT Time Calculation (min) 43 min   Activity Tolerance Patient tolerated treatment well;Patient limited by pain   Behavior During Therapy [facility] for tasks assessed/performed      Past Medical History:  Diagnosis Date  . Stroke [facility](HCC)    3 years ago (mini stroke)    No past surgical history on file.  There were no vitals filed for this visit.      Subjective Assessment - 09/21/15 0744    Subjective Pt. states she is doing okay this morning.  Pt. reports improved shoulder mobility after hot shower in morning.  No c/o pain at this time but achy.     Pertinent History Pt sought PT for R shoulder pain Dec-Jan of 2016/2017 with 100% improvement. States that she stopped performing HEP and sx have recurred.   Limitations Lifting;Writing;Other (comment)   Patient Stated Goals Pt would like to be able to work/sleep without shoulder pain and be able to lift/move R arm like she can L   Currently in Pain? No/denies        OBJECTIVE:  There.ex.: B UBE 3 min. F/b (warm-up/no charge).  Discussed HEP (good technique).  Seated bicep curls 2# 30x.  Nautilus: seated lat. Pull downs 20#/ standing tricep ext. 20#/ standing B sh. Adduction 20#/ scap. Retraction 20# 15x2.  Manual tx.: Supine L shoulder AA/PROM all planes 10x each.  Grade II-III mobs. Inf./ PA/ distraction 3x30 sec. Each in supine position to R shoulder (no pain).  Increase capsular mobility noted.       Pt response for medical necessity: pt. Benefits from  progression of R shoulder ROM/ stability ex. Program to improve pain-free mobility with daily and work-related tasks.  Pt. Progressing well with no increase c/o R sh. Pain.           PT Long Term Goals - 09/19/15 1205      PT LONG TERM GOAL #1   Title Pt will score 0% on Quick Dash to promote self percieved functional mobility and promote pain-free work duties/ADLs   Baseline 8/15: Quick Dash 9%   Time 4   Period Weeks   Status New     PT LONG TERM GOAL #2   Title Pt will demonstrate R UE strength gains with improvement of MMT testing by 1/2 grade to improve mobility.     Baseline R Shoulder flexion 4-/5, shoulder abduction 4-/5, ER/IR not tested formally   Time 4   Period Weeks   Status New     PT LONG TERM GOAL #3   Title Pt will be able to walk her dog using R arm with no greater than 1/10 R shoulder pain.   Baseline reports episode of 6/10 pain   Time 4   Period Weeks   Status New     PT LONG TERM GOAL #4   Title Pt will demonstrate R shoulder ROM comparable to L ROM with <2/10 pain at end range  Baseline R/L shoulder flexion: 153/154, shoulder abd 131/150, shoulder external rotation 30/80, HBB L2/T10   Time 4   Period Weeks   Status Achieved             Plan - 09/21/15 0750    Clinical Impression Statement Increase R shoulder capsular mobility after manual tx. session.  Pt. demonstrates good technique with HEP and encouraged to perform daily in a pain-free/ tolerable range.  No increase c/o pain reported after tx. session.     Rehab Potential Good   PT Frequency 2x / week   PT Duration 4 weeks   PT Treatment/Interventions ADLs/Self Care Home Management;Electrical Stimulation;Cryotherapy;Moist Heat;Therapeutic exercise;Therapeutic activities;Manual techniques;Functional mobility training;Patient/family education;Neuromuscular re-education;Passive range of motion;Dry needling   PT Next Visit Plan Progress strengthening plan, assess thoracic mobility   PT Home  Exercise Plan see HEP in patient instructions   Consulted and Agree with Plan of Care Patient      Patient will benefit from skilled therapeutic intervention in order to improve the following deficits and impairments:  Decreased activity tolerance, Decreased mobility, Decreased range of motion, Decreased strength, Hypomobility, Impaired UE functional use, Improper body mechanics, Postural dysfunction, Pain  Visit Diagnosis: Pain in right shoulder  Stiffness of right shoulder, not elsewhere classified  Abnormal posture  Pain in joint of right shoulder     Problem List Patient Active Problem List   Diagnosis Date Noted  . Chronic right shoulder pain 08/30/2015  . H/O transient cerebral ischemia 05/26/2014  . Hoarse 05/26/2014  . 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 Tendinitis of wrist 05/26/2014   Cammie McgeeMichael C Nykole Matos, PT, DPT # [id number]  09/22/2015, 12:21 PM  [place] [facility] [address] [place], [place], [address] Phone: [phone]   Fax:  [phone]  Name: Kathy Kennedy MRN: [id number] Date of Birth: 10-26-1955

## 2015-09-26 ENCOUNTER — Encounter: Payer: Self-pay | Admitting: Physical Therapy

## 2015-09-26 ENCOUNTER — Ambulatory Visit: Payer: Federal, State, Local not specified - PPO | Admitting: Physical Therapy

## 2015-09-26 DIAGNOSIS — M25511 Pain in right shoulder: Secondary | ICD-10-CM | POA: Diagnosis not present

## 2015-09-26 DIAGNOSIS — M25611 Stiffness of right shoulder, not elsewhere classified: Secondary | ICD-10-CM | POA: Diagnosis not present

## 2015-09-26 DIAGNOSIS — R293 Abnormal posture: Secondary | ICD-10-CM

## 2015-09-26 NOTE — Therapy (Signed)
[place] [facility] [address] [place], [place], [address] Phone: [phone]   Fax:  [phone]  Physical Therapy Treatment  Patient Details  Name: Kathy Kennedy MRN: [id number] Date of Birth: [birth year] Referring Provider: Reubin MilanLaura H Berglund MD  Encounter Date: 09/26/2015      PT End of Session - 09/26/15 0732    Visit Number 3   Number of Visits 8   Date for PT Re-Evaluation 10/18/15   PT Start Time 0728   PT Stop Time 0811   PT Time Calculation (min) 43 min   Activity Tolerance Patient tolerated treatment well   Behavior During Therapy [facility] for tasks assessed/performed      Past Medical History:  Diagnosis Date  . Stroke [facility](HCC)    3 years ago (mini stroke)    History reviewed. No pertinent surgical history.  There were no vitals filed for this visit.      Subjective Assessment - 09/26/15 0731    Subjective Pt states that she is a little stiff and painful this morning. Reports that a hot shower generally helps relieve her R shoulder pain but she is still experiencing 3/10 pain this morning.   Pertinent History Pt sought PT for R shoulder pain Dec-Jan of 2016/2017 with 100% improvement. States that she stopped performing HEP and sx have recurred.   Limitations Lifting;Writing;Other (comment)   Patient Stated Goals Pt would like to be able to work/sleep without shoulder pain and be able to lift/move R arm like she can L   Currently in Pain? Yes   Pain Score 3    Pain Location Shoulder   Pain Orientation Right   Pain Descriptors / Indicators Dull;Aching;Sharp;Shooting   Pain Type Chronic pain   Pain Onset More than a month ago      OBJECTIVE:  There.ex.: B UBE 2 min. F/b (warm-up/no charge). Nautilus: seated lat. Pull downs 20#/ standing tricep ext. 20#/ standing B sh. Adduction 20#/ scap. Rows 20# 3x10. Shoulder ext #10 3x10. Bicep curls #20 3x10. Chest press #20 3x10.  Reviewed HEP/ strength progression (pain-free).     Pt response for medical necessity: Pt presents with hypomobility of R shoulder with moderate capsular restrictions, decreased ROM and strength as compared to L shoulder. She tolerates all provided therapeutic exercise increase and reports overall decrease in shoulder pain sx from 3/10 to 0/10 with execution of there ex.          PT Long Term Goals - 09/19/15 1205      PT LONG TERM GOAL #1   Title Pt will score 0% on Quick Dash to promote self percieved functional mobility and promote pain-free work duties/ADLs   Baseline 8/15: Quick Dash 9%   Time 4   Period Weeks   Status New     PT LONG TERM GOAL #2   Title Pt will demonstrate R UE strength gains with improvement of MMT testing by 1/2 grade to improve mobility.     Baseline R Shoulder flexion 4-/5, shoulder abduction 4-/5, ER/IR not tested formally   Time 4   Period Weeks   Status New     PT LONG TERM GOAL #3   Title Pt will be able to walk her dog using R arm with no greater than 1/10 R shoulder pain.   Baseline reports episode of 6/10 pain   Time 4   Period Weeks   Status New     PT LONG TERM GOAL #4  Title Pt will demonstrate R shoulder ROM comparable to L ROM with <2/10 pain at end range   Baseline R/L shoulder flexion: 153/154, shoulder abd 131/150, shoulder external rotation 30/80, HBB L2/T10   Time 4   Period Weeks   Status Achieved           Plan - 09/26/15 1013    Clinical Impression Statement Pt presents with mild shoulder pain upon arrival for tx session but decreases to 0/10 with performance of therapeutic exercise. Pt most fatigued by shoulder add. ex with mild muscle trembling noted after several reps.    Rehab Potential Good   Clinical Impairments Affecting Rehab Potential Positive: Prior response to physical therapy, low intensity of pain. Negative:    PT Frequency 2x / week   PT Duration 4 weeks   PT Treatment/Interventions ADLs/Self Care Home Management;Electrical Stimulation;Cryotherapy;Moist  Heat;Therapeutic exercise;Therapeutic activities;Manual techniques;Functional mobility training;Patient/family education;Neuromuscular re-education;Passive range of motion;Dry needling   PT Next Visit Plan Progress strengthening program   PT Home Exercise Plan see HEP in patient instructions   Consulted and Agree with Plan of Care Patient      Patient will benefit from skilled therapeutic intervention in order to improve the following deficits and impairments:  Decreased activity tolerance, Decreased mobility, Decreased range of motion, Decreased strength, Hypomobility, Impaired UE functional use, Improper body mechanics, Postural dysfunction, Pain  Visit Diagnosis: Pain in right shoulder  Stiffness of right shoulder, not elsewhere classified  Abnormal posture     Problem List Patient Active Problem List   Diagnosis Date Noted  . Chronic right shoulder pain 08/30/2015  . H/O transient cerebral ischemia 05/26/2014  . Hoarse 05/26/2014  . 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 Tendinitis of wrist 05/26/2014   Cammie McgeeMichael C Sherk, PT, DPT # [id number] Vernona RiegerLaura Damaya Channing SPT 09/26/2015, 10:17 AM  [place] [facility] [address] [place], [place], [address] Phone: [phone]   Fax:  [phone]  Name: Kathy Kennedy MRN: [id number] Date of Birth: 02-Feb-1956

## 2015-09-28 ENCOUNTER — Ambulatory Visit: Payer: Federal, State, Local not specified - PPO | Admitting: Physical Therapy

## 2015-09-28 ENCOUNTER — Encounter: Payer: Self-pay | Admitting: Physical Therapy

## 2015-09-28 DIAGNOSIS — M25511 Pain in right shoulder: Secondary | ICD-10-CM

## 2015-09-28 DIAGNOSIS — M25611 Stiffness of right shoulder, not elsewhere classified: Secondary | ICD-10-CM | POA: Diagnosis not present

## 2015-09-28 DIAGNOSIS — R293 Abnormal posture: Secondary | ICD-10-CM

## 2015-09-28 NOTE — Therapy (Signed)
[place] [facility] [address] [place], [place], [address] Phone: [phone]   Fax:  [phone]  Physical Therapy Treatment  Patient Details  Name: Kathy Kennedy MRN: [id number] Date of Birth: 24-Jul-1955 Referring Provider: Reubin MilanLaura H Berglund MD  Encounter Date: 09/28/2015      PT End of Session - 09/28/15 0741    Visit Number 4   Number of Visits 8   Date for PT Re-Evaluation 10/18/15   PT Start Time 0729   PT Stop Time 0815   PT Time Calculation (min) 46 min   Activity Tolerance Patient tolerated treatment well   Behavior During Therapy [facility] for tasks assessed/performed      Past Medical History:  Diagnosis Date  . Stroke [facility](HCC)    3 years ago (mini stroke)    History reviewed. No pertinent surgical history.  There were no vitals filed for this visit.      Subjective Assessment - 09/28/15 0733    Subjective Pt with no c/o of pain at time of therapy session. Says she feels somewhat limited in R shoulder IR as compared to L. Overall states the shoulder is feeling better and she is not having problems sleeping at night.   Pertinent History Pt sought PT for R shoulder pain Dec-Jan of 2016/2017 with 100% improvement. States that she stopped performing HEP and sx have recurred.   Limitations Lifting;Writing;Other (comment)   Patient Stated Goals Pt would like to be able to work/sleep without shoulder pain and be able to lift/move R arm like she can L      OBJECTIVE: There.ex.: B UBE 2 min. F/b (warm-up/no charge).Nautilus: seated lat. Pull downs 30#/ standing tricep ext. 20#/ standing B sh. Adduction 20#/ scap. Rows 30# 3x10. Shoulder ext #20 3x10. Bicep curls #20 3x10. Chest press #20 3x10. Serratus punch #2 2x10. ER/IR #10 3x10. Supine shoulder flx with wand #2 20x/ shoulder horiz. Add/abd. 2# 20x.    Manual tx: Shoulder AP mobilization (grade II-III; 3 bouts, 1 minute). Pt reports initial pain 2/10 - 0/10 but decrease with  mobilization.  Reassessment of R shoulder AROM. STM to R UT/shoulder musculature in supine position.    Pt response for medical necessity: Pt presents with hypomobility of R shoulder with moderate capsular restrictions, decreased ROM and strength as compared to L shoulder. She tolerates all provided therapeutic exercise increase and reports overall decrease in shoulder pain sx from 2/10 to 0/10 following manual therapy treatment. Pt with painless clicking during supine wand weighted flx at end of ROM shoulder flx.   Progressing well with UE strengthening ex. program with no c/o shoulder pain.  Increase shoulder capsular mobility with full AROM noted after tx. session.  PT encouraged pt. to continue with ther.ex. program at home on a consistent basis and avoid any pain provoking movement patterns.          PT Long Term Goals - 09/19/15 1205      PT LONG TERM GOAL #1   Title Pt will score 0% on Quick Dash to promote self percieved functional mobility and promote pain-free work duties/ADLs   Baseline 8/15: Quick Dash 9%   Time 4   Period Weeks   Status New     PT LONG TERM GOAL #2   Title Pt will demonstrate R UE strength gains with improvement of MMT testing by 1/2 grade to improve mobility.     Baseline R Shoulder flexion 4-/5, shoulder abduction 4-/5, ER/IR not tested  formally   Time 4   Period Weeks   Status New     PT LONG TERM GOAL #3   Title Pt will be able to walk her dog using R arm with no greater than 1/10 R shoulder pain.   Baseline reports episode of 6/10 pain   Time 4   Period Weeks   Status New     PT LONG TERM GOAL #4   Title Pt will demonstrate R shoulder ROM comparable to L ROM with <2/10 pain at end range   Baseline R/L shoulder flexion: 153/154, shoulder abd 131/150, shoulder external rotation 30/80, HBB L2/T10   Time 4   Period Weeks   Status Achieved       Patient will benefit from skilled therapeutic intervention in order to improve the following  deficits and impairments:  Decreased activity tolerance, Decreased mobility, Decreased range of motion, Decreased strength, Hypomobility, Impaired UE functional use, Improper body mechanics, Postural dysfunction, Pain  Visit Diagnosis: Pain in right shoulder  Stiffness of right shoulder, not elsewhere classified  Abnormal posture     Problem List Patient Active Problem List   Diagnosis Date Noted  . Chronic right shoulder pain 08/30/2015  . H/O transient cerebral ischemia 05/26/2014  . Hoarse 05/26/2014  . 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 Tendinitis of wrist 05/26/2014   Cammie McgeeMichael C Sherk, PT, DPT # [id number] Michaelyn BarterLaura Jalaiyah Throgmorton, SPT 09/29/2015, 3:32 PM  [place] [facility] [address] [place], [place], [address] Phone: [phone]   Fax:  [phone]  Name: Kathy Kennedy MRN: [id number] Date of Birth: 08/14/1955

## 2015-10-03 ENCOUNTER — Ambulatory Visit: Payer: Federal, State, Local not specified - PPO | Admitting: Physical Therapy

## 2015-10-03 ENCOUNTER — Encounter: Payer: Self-pay | Admitting: Physical Therapy

## 2015-10-03 DIAGNOSIS — M25611 Stiffness of right shoulder, not elsewhere classified: Secondary | ICD-10-CM | POA: Diagnosis not present

## 2015-10-03 DIAGNOSIS — R293 Abnormal posture: Secondary | ICD-10-CM

## 2015-10-03 DIAGNOSIS — M25511 Pain in right shoulder: Secondary | ICD-10-CM | POA: Diagnosis not present

## 2015-10-03 NOTE — Therapy (Signed)
[place] [facility] [address] [place], [place], [address] Phone: [phone]   Fax:  [phone]  Physical Therapy Treatment  Patient Details  Name: Kathy RobinsBelinda L Lubrano MRN: [id number] Date of Birth: October 09, 1955 Referring Provider: Reubin MilanLaura H Berglund MD  Encounter Date: 10/03/2015      PT End of Session - 10/03/15 0742    Visit Number 5   Number of Visits 8   Date for PT Re-Evaluation 10/18/15   PT Start Time 0730   PT Stop Time 0823   PT Time Calculation (min) 53 min   Activity Tolerance Patient tolerated treatment well   Behavior During Therapy [facility] for tasks assessed/performed      Past Medical History:  Diagnosis Date  . Stroke [facility](HCC)    3 years ago (mini stroke)    History reviewed. No pertinent surgical history.  There were no vitals filed for this visit.      Subjective Assessment - 10/03/15 0738    Subjective Pt with no new c/o. States she can move the shoulder in all directions pain free.   Pertinent History Pt sought PT for R shoulder pain Dec-Jan of 2016/2017 with 100% improvement. States that she stopped performing HEP and sx have recurred.   Limitations Lifting;Writing;Other (comment)   Patient Stated Goals Pt would like to be able to work/sleep without shoulder pain and be able to lift/move R arm like she can L   Currently in Pain? No/denies      Objective:  Therapeutic Exercise: UBE f/b (2 min ea). Functional reach exercise with 20 cones at max shoulder flexion x2. Functional reach with #2-#3 weights 4x. Shoulder flexion with wand #2.5lb x10, #4lb 2x10. Nautilus: Chest press #20 2x10, Rows #30 2x10, Shoulder ext #20 2x10 cueing for shrug/thoracic compensation. Triceps #20 2x10. Shoulder add #20 2x10. ER/IR #10 2x10 ea. Issued green theraband to increase resistance for HEP.  Pt response for medical necessity: Pt with no c/o of pain during treatment session; she notices slight clicking in R shoulder during weighted  wand and resisted ER/IR exercises but experiences no pain. She demonstrates good functional mobility this session with no c/o when performing reaching task.       PT Long Term Goals - 09/19/15 1205      PT LONG TERM GOAL #1   Title Pt will score 0% on Quick Dash to promote self percieved functional mobility and promote pain-free work duties/ADLs   Baseline 8/15: Quick Dash 9%   Time 4   Period Weeks   Status New     PT LONG TERM GOAL #2   Title Pt will demonstrate R UE strength gains with improvement of MMT testing by 1/2 grade to improve mobility.     Baseline R Shoulder flexion 4-/5, shoulder abduction 4-/5, ER/IR not tested formally   Time 4   Period Weeks   Status New     PT LONG TERM GOAL #3   Title Pt will be able to walk her dog using R arm with no greater than 1/10 R shoulder pain.   Baseline reports episode of 6/10 pain   Time 4   Period Weeks   Status New     PT LONG TERM GOAL #4   Title Pt will demonstrate R shoulder ROM comparable to L ROM with <2/10 pain at end range   Baseline R/L shoulder flexion: 153/154, shoulder abd 131/150, shoulder external rotation 30/80, HBB L2/T10   Time 4   Period Weeks  Status Achieved               Plan - 10/03/15 1610    Clinical Impression Statement Pt demonstrates full AROM at onset of treatment session with no c/o of shoulder pain. She performs functional reaching activity and weighted strengthening tasks with no exacerbation of sx. Pt with tendency for compensatory shrug/thoracic flexion with shoulder ext exercise when performed with #30/better mechanics with #20. Pt encouraged to increase intensity of HEP program to begin progression towards independent management of shoulder sx.   Rehab Potential Good   Clinical Impairments Affecting Rehab Potential Positive: Prior response to physical therapy, low intensity of pain. Negative:    PT Frequency 2x / week   PT Duration 4 weeks   PT Treatment/Interventions ADLs/Self Care  Home Management;Electrical Stimulation;Cryotherapy;Moist Heat;Therapeutic exercise;Therapeutic activities;Manual techniques;Functional mobility training;Patient/family education;Neuromuscular re-education;Passive range of motion;Dry needling   PT Next Visit Plan Progress strengthening program   PT Home Exercise Plan see HEP in patient instructions   Consulted and Agree with Plan of Care Patient      Patient will benefit from skilled therapeutic intervention in order to improve the following deficits and impairments:  Decreased activity tolerance, Decreased mobility, Decreased range of motion, Decreased strength, Hypomobility, Impaired UE functional use, Improper body mechanics, Postural dysfunction, Pain  Visit Diagnosis: Pain in right shoulder  Stiffness of right shoulder, not elsewhere classified  Abnormal posture  Pain in joint of right shoulder     Problem List Patient Active Problem List   Diagnosis Date Noted  . Chronic right shoulder pain 08/30/2015  . H/O transient cerebral ischemia 05/26/2014  . Hoarse 05/26/2014  . 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 Tendinitis of wrist 05/26/2014   Cammie Mcgee, PT, DPT # [id number] Michaelyn Barter, SPT 10/04/2015, 2:45 PM  [place] [address] [place], [place], [address] Phone: [phone]   Fax:  [phone]  Name: AYLYN WENZLER MRN: [id number] Date of Birth: 08-06-1955

## 2015-10-05 ENCOUNTER — Ambulatory Visit: Payer: Federal, State, Local not specified - PPO | Admitting: Physical Therapy

## 2015-10-05 ENCOUNTER — Encounter: Payer: Self-pay | Admitting: Physical Therapy

## 2015-10-05 DIAGNOSIS — M25611 Stiffness of right shoulder, not elsewhere classified: Secondary | ICD-10-CM

## 2015-10-05 DIAGNOSIS — R293 Abnormal posture: Secondary | ICD-10-CM | POA: Diagnosis not present

## 2015-10-05 DIAGNOSIS — M25511 Pain in right shoulder: Secondary | ICD-10-CM | POA: Diagnosis not present

## 2015-10-05 NOTE — Therapy (Signed)
[place] [facility] [address] [place], [place], [address] Phone: [phone]   Fax:  [phone]  Physical Therapy Treatment  Patient Details  Name: Kathy Kennedy MRN: [id number] Date of Birth: 1955/05/24 Referring Provider: Reubin MilanLaura H Berglund MD  Encounter Date: 10/05/2015      PT End of Session - 10/05/15 0740    Visit Number 6   Number of Visits 8   Date for PT Re-Evaluation 10/18/15   PT Start Time 0733   PT Stop Time 0816   PT Time Calculation (min) 43 min   Activity Tolerance Patient tolerated treatment well   Behavior During Therapy [facility] for tasks assessed/performed      Past Medical History:  Diagnosis Date  . Stroke [facility](HCC)    3 years ago (mini stroke)    History reviewed. No pertinent surgical history.  There were no vitals filed for this visit.      Subjective Assessment - 10/05/15 0739    Subjective Pt arrives to PT appointment feeling good, states she doesn't feel she is fully awake yet. Pt with no c/o of shoulder pain for the last several days.   Pertinent History Pt sought PT for R shoulder pain Dec-Jan of 2016/2017 with 100% improvement. States that she stopped performing HEP and sx have recurred.   Limitations Lifting;Writing;Other (comment)   Patient Stated Goals Pt would like to be able to work/sleep without shoulder pain and be able to lift/move R arm like she can L   Currently in Pain? No/denies      Objective:  Therapeutic Exercise: UBE 2 minutes f/b ea. (warm up/no charge). Weighted flexion with wand #2.5 3x10. Weighted scaption 2# 3x10. Horizontal abduction with RTB 3x10. Nautilus: Chest press #20 2x10, Rows #30 3x10, Shoulder ext #20 3x10. Triceps #20 2x10. Shoulder add #20 3x10.  Pt response for medical necessity: Pt with no c/o of R shoulder pain during session. She demonstrates good technique with no compensatory strategies. She is progressing well through strengthening/mobility program.         PT Long Term Goals - 09/19/15 1205      PT LONG TERM GOAL #1   Title Pt will score 0% on Quick Dash to promote self percieved functional mobility and promote pain-free work duties/ADLs   Baseline 8/15: Quick Dash 9%   Time 4   Period Weeks   Status New     PT LONG TERM GOAL #2   Title Pt will demonstrate R UE strength gains with improvement of MMT testing by 1/2 grade to improve mobility.     Baseline R Shoulder flexion 4-/5, shoulder abduction 4-/5, ER/IR not tested formally   Time 4   Period Weeks   Status New     PT LONG TERM GOAL #3   Title Pt will be able to walk her dog using R arm with no greater than 1/10 R shoulder pain.   Baseline reports episode of 6/10 pain   Time 4   Period Weeks   Status New     PT LONG TERM GOAL #4   Title Pt will demonstrate R shoulder ROM comparable to L ROM with <2/10 pain at end range   Baseline R/L shoulder flexion: 153/154, shoulder abd 131/150, shoulder external rotation 30/80, HBB L2/T10   Time 4   Period Weeks   Status Achieved               Plan - 10/05/15 [id number]    Clinical  Impression Statement Pt continues to demonstrate good progress with strengthening/mobility program. She has no c/o of shoulder pain during session with increased emphasis on scapular plane exercises. Pt demonstrates improved body mechanics/no compensatory techniques this session.    Rehab Potential Good   Clinical Impairments Affecting Rehab Potential Positive: Prior response to physical therapy, low intensity of pain. Negative:    PT Frequency 2x / week   PT Duration 4 weeks   PT Treatment/Interventions ADLs/Self Care Home Management;Electrical Stimulation;Cryotherapy;Moist Heat;Therapeutic exercise;Therapeutic activities;Manual techniques;Functional mobility training;Patient/family education;Neuromuscular re-education;Passive range of motion;Dry needling   PT Next Visit Plan Progress strengthening program   PT Home Exercise Plan see HEP in patient  instructions   Consulted and Agree with Plan of Care Patient      Patient will benefit from skilled therapeutic intervention in order to improve the following deficits and impairments:  Decreased activity tolerance, Decreased mobility, Decreased range of motion, Decreased strength, Hypomobility, Impaired UE functional use, Improper body mechanics, Postural dysfunction, Pain  Visit Diagnosis: Pain in right shoulder  Stiffness of right shoulder, not elsewhere classified  Abnormal posture  Pain in joint of right shoulder     Problem List Patient Active Problem List   Diagnosis Date Noted  . Chronic right shoulder pain 08/30/2015  . H/O transient cerebral ischemia 05/26/2014  . Hoarse 05/26/2014  . 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 Tendinitis of wrist 05/26/2014   Cammie Mcgee, PT, DPT # [id number] Michaelyn Barter, SPT 10/05/2015, 1:52 PM  [facility] [address] [place], [place], [address] Phone: [phone]   Fax:  [phone]  Name: Kathy Kennedy MRN: [id number] Date of Birth: May 04, 1955

## 2015-10-10 ENCOUNTER — Ambulatory Visit: Payer: Federal, State, Local not specified - PPO | Attending: Orthopedic Surgery | Admitting: Physical Therapy

## 2015-10-10 ENCOUNTER — Encounter: Payer: Self-pay | Admitting: Physical Therapy

## 2015-10-10 DIAGNOSIS — M25511 Pain in right shoulder: Secondary | ICD-10-CM | POA: Insufficient documentation

## 2015-10-10 DIAGNOSIS — M25611 Stiffness of right shoulder, not elsewhere classified: Secondary | ICD-10-CM | POA: Diagnosis not present

## 2015-10-10 DIAGNOSIS — R293 Abnormal posture: Secondary | ICD-10-CM | POA: Diagnosis not present

## 2015-10-10 NOTE — Therapy (Signed)
[facility] [address] [place], [place], [address] Phone: [phone]   Fax:  [phone]  Physical Therapy Treatment  Patient Details  Name: Kathy RobinsBelinda L Munguia MRN: [id number] Date of Birth: 16-Apr-1955 Referring Provider: Reubin MilanLaura H Berglund MD  Encounter Date: 10/10/2015      PT End of Session - 10/10/15 0816    Visit Number 7   Number of Visits 8   Date for PT Re-Evaluation 10/18/15   PT Start Time 0731   PT Stop Time 0814   PT Time Calculation (min) 43 min   Activity Tolerance Patient tolerated treatment well   Behavior During Therapy [facility] for tasks assessed/performed      Past Medical History:  Diagnosis Date  . Stroke [facility](HCC)    3 years ago (mini stroke)    History reviewed. No pertinent surgical history.  There were no vitals filed for this visit.      Subjective Assessment - 10/10/15 0738    Subjective Pt reports that her shoulder "feels like its working," She is amenable to decreasing her frequency for the last week of her PT treatment to progress toward independent management of sx.    Pertinent History Pt sought PT for R shoulder pain Dec-Jan of 2016/2017 with 100% improvement. States that she stopped performing HEP and sx have recurred.   Limitations Lifting;Writing;Other (comment)   Patient Stated Goals Pt would like to be able to work/sleep without shoulder pain and be able to lift/move R arm like she can L   Currently in Pain? No/denies       Objective:  Therapeutic Exercise: UBE 2 minutes f/b ea. (warm up/no charge). Weighted flexion with wand #2.5 3x10. Weighted scaption 2# 3x10. Horizontal abduction with RTB 3x10. Closed chain long lever stabilization 1 minute x4; pt difficulty maintaining elbow ext.  Nautilus: Shoulder ext #30 1x10, #20 1x10. Triceps #20 2x10. Internal Rot #10 3x10. Biceps #20 2x10.  Pt response for medical necessity: Pt with no c/o of R shoulder pain during session. She demonstrates  good technique, only with mild compensatory strategies in response to increased weight on Nautilus. She is progressing well through strengthening/mobility program and will progress to independent management of shoulder pain.       PT Long Term Goals - 09/19/15 1205      PT LONG TERM GOAL #1   Title Pt will score 0% on Quick Dash to promote self percieved functional mobility and promote pain-free work duties/ADLs   Baseline 8/15: Quick Dash 9%   Time 4   Period Weeks   Status New     PT LONG TERM GOAL #2   Title Pt will demonstrate R UE strength gains with improvement of MMT testing by 1/2 grade to improve mobility.     Baseline R Shoulder flexion 4-/5, shoulder abduction 4-/5, ER/IR not tested formally   Time 4   Period Weeks   Status New     PT LONG TERM GOAL #3   Title Pt will be able to walk her dog using R arm with no greater than 1/10 R shoulder pain.   Baseline reports episode of 6/10 pain   Time 4   Period Weeks   Status New     PT LONG TERM GOAL #4   Title Pt will demonstrate R shoulder ROM comparable to L ROM with <2/10 pain at end range   Baseline R/L shoulder flexion: 153/154, shoulder abd 131/150, shoulder external rotation 30/80, HBB L2/T10  Time 4   Period Weeks   Status Achieved               Plan - 10/10/15 1610    Clinical Impression Statement Pt with no c/o of shoulder pain for last several sessions and no mobility deficits. She demonstrates good tolerance of strengthening ther ex; with mild compensatory shrug for shoulder ext with increased weight. Plans to reduce frequency of PT to promote progression of independent mangagement of shoulder sx.   Rehab Potential Good   Clinical Impairments Affecting Rehab Potential Positive: Prior response to physical therapy, low intensity of pain. Negative:    PT Frequency 2x / week   PT Duration 4 weeks   PT Treatment/Interventions ADLs/Self Care Home Management;Electrical Stimulation;Cryotherapy;Moist  Heat;Therapeutic exercise;Therapeutic activities;Manual techniques;Functional mobility training;Patient/family education;Neuromuscular re-education;Passive range of motion;Dry needling   PT Next Visit Plan Reassess Goals. D/C   PT Home Exercise Plan see HEP in patient instructions   Consulted and Agree with Plan of Care Patient      Patient will benefit from skilled therapeutic intervention in order to improve the following deficits and impairments:  Decreased activity tolerance, Decreased mobility, Decreased range of motion, Decreased strength, Hypomobility, Impaired UE functional use, Improper body mechanics, Postural dysfunction, Pain  Visit Diagnosis: Pain in right shoulder  Stiffness of right shoulder, not elsewhere classified  Abnormal posture  Pain in joint of right shoulder     Problem List Patient Active Problem List   Diagnosis Date Noted  . Chronic right shoulder pain 08/30/2015  . H/O transient cerebral ischemia 05/26/2014  . Hoarse 05/26/2014  . 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 Tendinitis of wrist 05/26/2014   Cammie Mcgee, PT, DPT # [id number] Vernona Rieger Ledon Weihe SPT 10/10/2015, 8:23 AM  [place] [facility] [address] [place], [place], [address] Phone: [phone]   Fax:  [phone]  Name: LELIANA KONTZ MRN: [id number] Date of Birth: 1955/11/28

## 2015-10-12 ENCOUNTER — Encounter: Payer: Federal, State, Local not specified - PPO | Admitting: Physical Therapy

## 2015-10-17 ENCOUNTER — Encounter: Payer: Self-pay | Admitting: Physical Therapy

## 2015-10-17 ENCOUNTER — Ambulatory Visit: Payer: Federal, State, Local not specified - PPO | Admitting: Physical Therapy

## 2015-10-17 DIAGNOSIS — M25611 Stiffness of right shoulder, not elsewhere classified: Secondary | ICD-10-CM

## 2015-10-17 DIAGNOSIS — R293 Abnormal posture: Secondary | ICD-10-CM

## 2015-10-17 DIAGNOSIS — M25511 Pain in right shoulder: Secondary | ICD-10-CM | POA: Diagnosis not present

## 2015-10-17 NOTE — Therapy (Signed)
[place] [facility] [address] [place], [place], [address] Phone: [phone]   Fax:  [phone]  Physical Therapy Treatment  Patient Details  Name: Kathy Kennedy MRN: [id number] Date of Birth: 04-29-1955 Referring Provider: Glean Hess MD  Encounter Date: 10/17/2015      PT End of Session - 10/17/15 1348    Visit Number 8   Number of Visits 8   Date for PT Re-Evaluation 10/18/15   PT Start Time 0740   PT Stop Time 0809   PT Time Calculation (min) 29 min   Activity Tolerance Patient tolerated treatment well   Behavior During Therapy [facility] for tasks assessed/performed      Past Medical History:  Diagnosis Date  . Stroke [facility])    3 years ago (mini stroke)    History reviewed. No pertinent surgical history.  There were no vitals filed for this visit.      Subjective Assessment - 10/17/15 1252    Subjective Pt. states she is doing well.  Pt. reports no pain and demonstrates full B shoulder AROM (all planes).     Pertinent History Pt sought PT for R shoulder pain Dec-Jan of 2016/2017 with 100% improvement. States that she stopped performing HEP and sx have recurred.   Limitations Lifting;Writing;Other (comment)   Patient Stated Goals Pt would like to be able to work/sleep without shoulder pain and be able to lift/move R arm like she can L   Currently in Pain? No/denies      Objective:  Therapeutic Exercise: UBE 2 minutes f/b ea. (warm up/no charge).  Nautilus: standing lat. Pull downs 40#/ Shoulder ext #30/ Triceps #20/ Internal Rot #10/20#/ Bicep curls #20/ scap. Retraction 30# 20x each.  Reviewed HEP and discussed progression of ex.    Pt response for medical necessity: Pt with no c/o of R shoulder pain during session. She demonstrates good technique and independent with all aspects of HEP at this time.        PT Long Term Goals - 10/17/15 1403      PT LONG TERM GOAL #1   Title Pt will score 0% on Quick Dash  to promote self percieved functional mobility and promote pain-free work duties/ADLs   Baseline QuickDASH: 0% on 9/12    Time 4   Period Weeks   Status Achieved     PT LONG TERM GOAL #2   Title Pt will demonstrate R UE strength gains with improvement of MMT testing by 1/2 grade to improve mobility.     Baseline B shoulder strength 5/5 MMT except flexion 4+/5 MMT.    Time 4   Period Weeks   Status Achieved     PT LONG TERM GOAL #3   Title Pt will be able to walk her dog using R arm with no greater than 1/10 R shoulder pain.   Baseline 0/10 pain currently.     Time 4   Period Weeks   Status Achieved     PT LONG TERM GOAL #4   Title Pt will demonstrate R shoulder ROM comparable to L ROM with <2/10 pain at end range   Baseline B shoulder AROM WNL   Time 4   Period Weeks   Status Achieved     PT LONG TERM GOAL #5   Title Pt will increase R shoulder muscle strength by 1/2 MMT in order to regain functional mobility without increased pain.   Time 4   Period Weeks  Status Achieved            Plan - 10/17/15 1348    Clinical Impression Statement Pt. has progressed well and acheived all PT goals.  No c/o shoulder pain during tx. session and pt. will continue with an independent HEP at this time (2-3x/week).  QuickDASH: 0% (goal met).  Grip strength: L (67#), R (59#).  Pt. instructed to contact PT is a few weeks if any regression or issues with shoulder.     Rehab Potential Good   PT Frequency 2x / week   PT Duration 4 weeks   PT Treatment/Interventions ADLs/Self Care Home Management;Electrical Stimulation;Cryotherapy;Moist Heat;Therapeutic exercise;Therapeutic activities;Manual techniques;Functional mobility training;Patient/family education;Neuromuscular re-education;Passive range of motion;Dry needling   PT Next Visit Plan Discharge from PT at this time.     PT Home Exercise Plan see HEP in patient instructions   Consulted and Agree with Plan of Care Patient      Patient  will benefit from skilled therapeutic intervention in order to improve the following deficits and impairments:  Decreased activity tolerance, Decreased mobility, Decreased range of motion, Decreased strength, Hypomobility, Impaired UE functional use, Improper body mechanics, Postural dysfunction, Pain  Visit Diagnosis: Pain in right shoulder  Stiffness of right shoulder, not elsewhere classified  Abnormal posture     Problem List Patient Active Problem List   Diagnosis Date Noted  . Chronic right shoulder pain 08/30/2015  . H/O transient cerebral ischemia 05/26/2014  . Hoarse 05/26/2014  . 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 Tendinitis of wrist 05/26/2014   Pura Spice, PT, DPT # [id number] 10/17/2015, 2:08 PM  [place] [facility] [address] [place], [place], [address] Phone: [phone]   Fax:  [phone]  Name: Kathy Kennedy MRN: [id number] Date of Birth: 1955/12/06

## 2015-10-25 DIAGNOSIS — H43812 Vitreous degeneration, left eye: Secondary | ICD-10-CM | POA: Diagnosis not present

## 2016-02-14 DIAGNOSIS — Z1281 Encounter for screening for malignant neoplasm of oral cavity: Secondary | ICD-10-CM | POA: Diagnosis not present

## 2016-02-14 DIAGNOSIS — K0510 Chronic gingivitis, plaque induced: Secondary | ICD-10-CM | POA: Diagnosis not present

## 2016-02-26 ENCOUNTER — Ambulatory Visit: Payer: Federal, State, Local not specified - PPO | Admitting: Family Medicine

## 2016-03-12 ENCOUNTER — Ambulatory Visit (INDEPENDENT_AMBULATORY_CARE_PROVIDER_SITE_OTHER): Payer: Federal, State, Local not specified - PPO | Admitting: Family Medicine

## 2016-03-12 ENCOUNTER — Encounter: Payer: Self-pay | Admitting: Family Medicine

## 2016-03-12 VITALS — BP 113/76 | HR 76 | Temp 97.8°F | Resp 16 | Ht 64.0 in | Wt 140.0 lb

## 2016-03-12 DIAGNOSIS — R5383 Other fatigue: Secondary | ICD-10-CM

## 2016-03-12 DIAGNOSIS — J309 Allergic rhinitis, unspecified: Secondary | ICD-10-CM | POA: Insufficient documentation

## 2016-03-12 DIAGNOSIS — R05 Cough: Secondary | ICD-10-CM | POA: Diagnosis not present

## 2016-03-12 DIAGNOSIS — Z0000 Encounter for general adult medical examination without abnormal findings: Secondary | ICD-10-CM

## 2016-03-12 DIAGNOSIS — R69 Illness, unspecified: Secondary | ICD-10-CM

## 2016-03-12 DIAGNOSIS — J111 Influenza due to unidentified influenza virus with other respiratory manifestations: Secondary | ICD-10-CM

## 2016-03-12 DIAGNOSIS — R059 Cough, unspecified: Secondary | ICD-10-CM

## 2016-03-12 DIAGNOSIS — J302 Other seasonal allergic rhinitis: Secondary | ICD-10-CM

## 2016-03-12 DIAGNOSIS — Z8673 Personal history of transient ischemic attack (TIA), and cerebral infarction without residual deficits: Secondary | ICD-10-CM | POA: Diagnosis not present

## 2016-03-12 LAB — POCT INFLUENZA A/B
Influenza A, POC: NEGATIVE
Influenza B, POC: NEGATIVE

## 2016-03-12 NOTE — Progress Notes (Signed)
Date:  03/12/2016   Name:  Kathy Kennedy   DOB:  Nov 19, 1955   MRN:  [id number]  PCP:  Schuyler AmorWilliam Natividad Halls, MD    Chief Complaint: Cough (Cough, nasal cong, back and body ache, Sore throat, chills and temp changes x 3 days. ) and Fatigue (Going on few months now. No energy drinks [employer] Drink Daily. )   History of Present Illness:  This is a 61 y.o. female with 3d hx subj fever/chills, rhinorrhea, dry cough, sl ST, and myalgias. No flu imm this year. Also c/o fatigue x several months ass with bloodshot eyes, eye sxs improved with stopping [employer] energy drinks but fatigue persists, uses nasal strips to help breathe at night but denies sign snoring.  Review of Systems:  Review of Systems  HENT: Negative for ear pain and trouble swallowing.   Eyes: Negative for pain.  Respiratory: Negative for shortness of breath.   Cardiovascular: Negative for chest pain and leg swelling.  Gastrointestinal: Negative for abdominal pain.  Genitourinary: Negative for dysuria.  Neurological: Negative for syncope and light-headedness.    Patient Active Problem List   Diagnosis Date Noted  . Allergic rhinitis 03/12/2016  . Chronic right shoulder pain 08/30/2015  . H/O transient cerebral ischemia 05/26/2014  . Hoarse 05/26/2014  . 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 Tendinitis of wrist 05/26/2014    Prior to Admission medications   Medication Sig Start Date End Date Taking? Authorizing Provider  Pseudoephedrine-APAP-DM (DAYQUIL PO) Take by mouth.   Yes Historical Provider, MD  Pseudoephedrine-Guaifenesin [facility](MUCINEX D PO) Take by mouth.   Yes Historical Provider, MD  sodium-potassium bicarbonate (ALKA-SELTZER GOLD) TBEF dissolvable tablet Take 1 tablet by mouth daily as needed.   Yes Historical Provider, MD  Zinc Gluconate (COLD-EEZE PO) Take by mouth.   Yes Historical Provider, MD    Allergies  Allergen Reactions  . Molds & Smuts Other (See Comments)    sneezing    History  reviewed. No pertinent surgical history.  Social History  Substance Use Topics  . Smoking status: Former Smoker    Packs/day: 1.00    Years: 10.00    Quit date: 02/04/1984  . Smokeless tobacco: Never Used  . Alcohol use No    Family History  Problem Relation Age of Onset  . Hyperlipidemia Mother   . Heart attack Father     Medication list has been reviewed and updated.  Physical Examination: BP 113/76   Pulse 76   Temp 97.8 F (36.6 C)   Resp 16   Ht 5\' 4"  (1.626 m)   Wt 140 lb (63.5 kg)   LMP 02/03/2013   SpO2 99%   BMI 24.03 kg/m   Physical Exam  Constitutional: She appears well-developed and well-nourished.  Eyes: Conjunctivae are normal. Pupils are equal, round, and reactive to light.  Cardiovascular: Normal rate and regular rhythm.   Pulmonary/Chest: Effort normal and breath sounds normal.  Musculoskeletal: She exhibits no edema.  Neurological: She is alert.  Skin: Skin is warm and dry.  Psychiatric: She has a normal mood and affect. Her behavior is normal.  Nursing note and vitals reviewed.   Assessment and Plan:  1. Influenza-like illness POCT flu swab negative, sx rx discussed, OOW today and tomorrow (note given), call if sxs worsen/persist  2. Cough - POCT Influenza A/B  3. Other fatigue Unclear etiology, check labs, consider chronic allergies, depression, Sjogren's if ok - Comprehensive Metabolic Panel (CMET) - CBC - TSH  4. Chronic  seasonal allergic rhinitis, unspecified trigger  5. Healthcare maintenance - Vitamin D (25 hydroxy) - Hepatitis C Antibody - HIV antibody (with reflex) Consider Tdap/zoster imm/colonoscopy/mammogram next visit  6. H/O transient cerebral ischemia Discuss possible asa/statin therapy next visit  Return in about 4 weeks (around 04/09/2016).  Dionne Ano. Kingsley Spittle MD [facility]  03/12/2016

## 2016-03-13 LAB — COMPREHENSIVE METABOLIC PANEL
A/G RATIO: 1.7 (ref 1.2–2.2)
ALT: 41 IU/L — AB (ref 0–32)
AST: 41 IU/L — ABNORMAL HIGH (ref 0–40)
Albumin: 4 g/dL (ref 3.6–4.8)
Alkaline Phosphatase: 70 IU/L (ref 39–117)
BUN/Creatinine Ratio: 16 (ref 12–28)
BUN: 10 mg/dL (ref 8–27)
Bilirubin Total: 0.2 mg/dL (ref 0.0–1.2)
CALCIUM: 9 mg/dL (ref 8.7–10.3)
CO2: 25 mmol/L (ref 18–29)
Chloride: 100 mmol/L (ref 96–106)
Creatinine, Ser: 0.62 mg/dL (ref 0.57–1.00)
GFR, EST AFRICAN AMERICAN: 113 mL/min/{1.73_m2} (ref >59)
GFR, EST NON AFRICAN AMERICAN: 98 mL/min/{1.73_m2} (ref >59)
GLOBULIN, TOTAL: 2.4 g/dL (ref 1.5–4.5)
Glucose: 99 mg/dL (ref 65–99)
POTASSIUM: 4.2 mmol/L (ref 3.5–5.2)
Sodium: 142 mmol/L (ref 134–144)
Total Protein: 6.4 g/dL (ref 6.0–8.5)

## 2016-03-13 LAB — TSH: TSH: 1.55 u[IU]/mL (ref 0.450–4.500)

## 2016-03-13 LAB — VITAMIN D 25 HYDROXY (VIT D DEFICIENCY, FRACTURES): Vit D, 25-Hydroxy: 32.7 ng/mL (ref 30.0–100.0)

## 2016-03-13 LAB — HIV ANTIBODY (ROUTINE TESTING W REFLEX): HIV Screen 4th Generation wRfx: NONREACTIVE

## 2016-03-13 LAB — CBC
HEMOGLOBIN: 13 g/dL (ref 11.1–15.9)
Hematocrit: 39.2 % (ref 34.0–46.6)
MCH: 30 pg (ref 26.6–33.0)
MCHC: 33.2 g/dL (ref 31.5–35.7)
MCV: 91 fL (ref 79–97)
Platelets: 235 10*3/uL (ref 150–379)
RBC: 4.33 x10E6/uL (ref 3.77–5.28)
RDW: 13.4 % (ref 12.3–15.4)
WBC: 5 10*3/uL (ref 3.4–10.8)

## 2016-03-13 LAB — HEPATITIS C ANTIBODY

## 2016-03-13 NOTE — Progress Notes (Signed)
Advised 

## 2016-04-15 ENCOUNTER — Ambulatory Visit: Payer: Federal, State, Local not specified - PPO | Admitting: Family Medicine

## 2016-05-08 ENCOUNTER — Ambulatory Visit (INDEPENDENT_AMBULATORY_CARE_PROVIDER_SITE_OTHER): Payer: Federal, State, Local not specified - PPO | Admitting: Family Medicine

## 2016-05-08 ENCOUNTER — Encounter: Payer: Self-pay | Admitting: Family Medicine

## 2016-05-08 VITALS — BP 108/68 | HR 67 | Temp 97.6°F | Resp 16 | Ht 64.0 in | Wt 130.0 lb

## 2016-05-08 DIAGNOSIS — Z8673 Personal history of transient ischemic attack (TIA), and cerebral infarction without residual deficits: Secondary | ICD-10-CM

## 2016-05-08 DIAGNOSIS — J302 Other seasonal allergic rhinitis: Secondary | ICD-10-CM

## 2016-05-08 DIAGNOSIS — Z8639 Personal history of other endocrine, nutritional and metabolic disease: Secondary | ICD-10-CM | POA: Diagnosis not present

## 2016-05-08 DIAGNOSIS — M722 Plantar fascial fibromatosis: Secondary | ICD-10-CM

## 2016-05-08 DIAGNOSIS — F418 Other specified anxiety disorders: Secondary | ICD-10-CM

## 2016-05-08 DIAGNOSIS — F419 Anxiety disorder, unspecified: Secondary | ICD-10-CM | POA: Insufficient documentation

## 2016-05-08 MED ORDER — ASPIRIN 81 MG PO TABS: 81.0000 mg | ORAL_TABLET | Freq: Every day | ORAL | Status: DC

## 2016-05-08 MED ORDER — CITALOPRAM HYDROBROMIDE 20 MG PO TABS: 20.0000 mg | ORAL_TABLET | Freq: Every day | ORAL | 2 refills | Status: DC

## 2016-05-08 NOTE — Progress Notes (Signed)
Date:  05/08/2016   Name:  Kathy Kennedy   DOB:  12/01/1955   MRN:  [id number]  PCP:  Schuyler Amor, MD    Chief Complaint: Foot Pain (Left foot x 1-2 weeks on bottom. No injury related. ); Joint Pain (Hips and shoulders off and on for 1-2 weeks. ); and Chest Pain (Describes as dull pain Left side of chest. Has SOB at times. Denies cold or congestion, anxiety is common in her life right now but nothing big going on that would cause pressure in chest. )   History of Present Illness:  This is a 61 y.o. female seen for two month f/u. Flu-like illness sxs resolved, fatigue still there, sleeps ok, denies snoring, more anxiety lately, Celexa helped in past. C/o L foot pain past week, worse in work shoes. AR sxs better. Hx TIA was on Aggrenox in past but told to stop, never started asa. Lipids high in past, prev on Lipitor, no recent lipids in chart.  Review of Systems:  Review of Systems  Constitutional: Negative for fever.  Respiratory: Negative for cough and shortness of breath.   Cardiovascular: Negative for leg swelling.  Gastrointestinal: Negative for abdominal pain.  Genitourinary: Negative for difficulty urinating.  Neurological: Negative for syncope and light-headedness.    Patient Active Problem List   Diagnosis Date Noted  . Anxiety 05/08/2016  . Allergic rhinitis 03/12/2016  . Chronic right shoulder pain 08/30/2015  . H/O transient cerebral ischemia 05/26/2014  . Hoarse 05/26/2014  . 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 Tendinitis of wrist 05/26/2014    Prior to Admission medications   Medication Sig Start Date End Date Taking? Authorizing Provider  Misc Natural Products (GINSENG-AMERICAN PO) Take by mouth.   Yes Historical Provider, MD  aspirin 81 MG tablet Take 1 tablet (81 mg total) by mouth daily. 05/08/16   Schuyler Amor, MD  citalopram (CELEXA) 20 MG tablet Take 1 tablet (20 mg total) by mouth daily. 05/08/16   Schuyler Amor, MD    Allergies  Allergen  Reactions  . Molds & Smuts Other (See Comments)    sneezing    History reviewed. No pertinent surgical history.  Social History  Substance Use Topics  . Smoking status: Former Smoker    Packs/day: 1.00    Years: 10.00    Quit date: 02/04/1984  . Smokeless tobacco: Never Used  . Alcohol use No    Family History  Problem Relation Age of Onset  . Hyperlipidemia Mother   . Heart attack Father     Medication list has been reviewed and updated.  Physical Examination: BP 108/68   Pulse 67   Temp 97.6 F (36.4 C)   Resp 16   Ht  (1.626 m)   Wt 130 lb (59 kg)   LMP 02/03/2013   SpO2 100%   BMI 22.31 kg/m   Physical Exam  Constitutional: She appears well-developed and well-nourished.  Cardiovascular: Normal rate and regular rhythm.   Pulmonary/Chest: Effort normal and breath sounds normal.  Musculoskeletal: She exhibits no edema.  Sl tender L anterior plantar fascia  Neurological: She is alert.  Skin: Skin is warm and dry.  Psychiatric: Her behavior is normal.  Depressed affect  Nursing note and vitals reviewed.   Assessment and Plan:  1. Plantar fasciitis Trial OTC arch supports, consider podiatry referral if persists  2. Chronic seasonal allergic rhinitis, unspecified trigger Improved  3. Depression with anxiety Restart Celexa, consider Cymbalta or Flexeril if sxs  persist as may be element of fibromyalgia - B12  4. H/O transient cerebral ischemia Restart OTC asa - Lipid Profile  5. Hx of hyperlipidemia Check lipids, consider statin  Return in about 4 weeks (around 06/05/2016).  Dionne Ano. Kingsley Spittle MD [facility]  05/08/2016

## 2016-05-09 LAB — LIPID PANEL
CHOLESTEROL TOTAL: 230 mg/dL — AB (ref 100–199)
Chol/HDL Ratio: 3.5 ratio (ref 0.0–4.4)
HDL: 66 mg/dL (ref >39)
LDL CALC: 144 mg/dL — AB (ref 0–99)
Triglycerides: 99 mg/dL (ref 0–149)
VLDL Cholesterol Cal: 20 mg/dL (ref 5–40)

## 2016-05-09 LAB — VITAMIN B12: VITAMIN B 12: 813 pg/mL (ref 232–1245)

## 2016-06-25 ENCOUNTER — Encounter: Payer: Self-pay | Admitting: Emergency Medicine

## 2016-06-25 ENCOUNTER — Ambulatory Visit
Admission: EM | Admit: 2016-06-25 | Discharge: 2016-06-25 | Disposition: A | Discharge status: Home / Self Care | Payer: Federal, State, Local not specified - PPO | Attending: Family Medicine | Admitting: Family Medicine

## 2016-06-25 DIAGNOSIS — B001 Herpesviral vesicular dermatitis: Secondary | ICD-10-CM | POA: Diagnosis not present

## 2016-06-25 MED ORDER — VALACYCLOVIR HCL 1 G PO TABS: 2000.0000 mg | ORAL_TABLET | Freq: Two times a day (BID) | ORAL | 0 refills | Status: AC

## 2016-06-25 NOTE — ED Triage Notes (Signed)
Patient c/o sores in her mouth since last Thursday.

## 2016-06-25 NOTE — ED Provider Notes (Signed)
[facility]    CSN: [id number] Arrival date & time: 06/25/16  1638     History   Chief Complaint Chief Complaint  Patient presents with  . Mouth Lesions    HPI Kathy Kennedy is a 61 y.o. female.   Patient with fever blisters in both upper and lower,. She gets stressed out to have these ulcerations lesions in her mouth. His been there for about 6 days now. She states that both upper and lower mouth is irritated. She reports being under a lot of stress trying to get ready for vacation when this hit her. She has no history of herpetic genital lesions she's been told that her fever blisters could be herpetic in nature. She had a stroke 3 years ago history of anxiety hyperlipidemia chronic right shoulder pain and degenerative arthritis of the joints. Mother has hyperlipidemia father had heart attack she is a former smoker. History of allergies to molds.   The history is provided by the patient.  Mouth Lesions  Location:  Buccal mucosa, lower gingiva and upper gingiva Buccal mucosa location:  L buccal mucosa and R buccal mucosa Upper gingiva location:  L buccal and R buccal Lower gingiva location:  L buccal and R buccal Quality:  Ulcerous and painful Pain details:    Severity:  Moderate   Timing:  Constant   Progression:  Worsening Onset quality:  Sudden Duration:  6 days Progression:  Worsening Chronicity:  New Context: change in diet and possible infection   Relieved by:  Nothing Worsened by:  Nothing Ineffective treatments:  None tried   Past Medical History:  Diagnosis Date  . Stroke [facility](HCC)    3 years ago (mini stroke)    Patient Active Problem List   Diagnosis Date Noted  . Anxiety 05/08/2016  . Hx of hyperlipidemia 05/08/2016  . Allergic rhinitis 03/12/2016  . Chronic right shoulder pain 08/30/2015  . H/O transient cerebral ischemia 05/26/2014  . Hoarse 05/26/2014  . 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 Tendinitis of wrist  05/26/2014    History reviewed. No pertinent surgical history.  OB History    Gravida Para Term Preterm AB Living   0 0 0 0 0 0   SAB TAB Ectopic Multiple Live Births   0 0 0 0         Home Medications    Prior to Admission medications   Medication Sig Start Date End Date Taking? Authorizing Provider  aspirin 81 MG tablet Take 1 tablet (81 mg total) by mouth daily. 05/08/16   Plonk, Chrissie NoaWilliam, MD  citalopram (CELEXA) 20 MG tablet Take 1 tablet (20 mg total) by mouth daily. 05/08/16   Plonk, Chrissie NoaWilliam, MD  Misc Natural Products (GINSENG-AMERICAN PO) Take by mouth.    [provider]  valACYclovir (VALTREX) 1000 MG tablet Take 2 tablets (2,000 mg total) by mouth 2 (two) times daily. 06/25/16 06/26/16  Hassan RowanWade, Eliel Dudding, MD    Family History Family History  Problem Relation Age of Onset  . Hyperlipidemia Mother   . Heart attack Father     Social History Social History  Substance Use Topics  . Smoking status: Former Smoker    Packs/day: 1.00    Years: 10.00    Quit date: 02/04/1984  . Smokeless tobacco: Never Used  . Alcohol use No     Allergies   Molds & smuts   Review of Systems Review of Systems  HENT: Positive for mouth sores.   All other  systems reviewed and are negative.    Physical Exam Triage Vital Signs ED Triage Vitals  Enc Vitals Group     BP 06/25/16 1741 (!) 141/78     Pulse Rate 06/25/16 1741 65     Resp 06/25/16 1741 16     Temp 06/25/16 1741 98.1 F (36.7 C)     Temp Source 06/25/16 1741 Oral     SpO2 06/25/16 1741 100 %     Weight 06/25/16 1738 140 lb (63.5 kg)     Height 06/25/16 1738 5\' 4"  (1.626 m)     Head Circumference --      Peak Flow --      Pain Score 06/25/16 1739 5     Pain Loc --      Pain Edu? --      Excl. in GC? --    No data found.   Updated Vital Signs BP (!) 141/78 (BP Location: Left Arm)   Pulse 65   Temp 98.1 F (36.7 C) (Oral)   Resp 16   Ht 5\' 4"  (1.626 m)   Wt 140 lb (63.5 kg)   LMP 02/03/2013   SpO2  100%   BMI 24.03 kg/m   Visual Acuity Right Eye Distance:   Left Eye Distance:   Bilateral Distance:    Right Eye Near:   Left Eye Near:    Bilateral Near:     Physical Exam  Constitutional: She is oriented to person, place, and time. She appears well-developed and well-nourished.  HENT:  Head: Normocephalic and atraumatic.  Right Ear: Hearing, tympanic membrane, external ear and ear canal normal.  Left Ear: Hearing, tympanic membrane, external ear and ear canal normal.  Nose: Mucosal edema present.  Mouth/Throat: Oropharynx is clear and moist and mucous membranes are normal. Oral lesions present.    Eyes: Pupils are equal, round, and reactive to light.  Neck: Normal range of motion. Neck supple.  Pulmonary/Chest: Effort normal.  Musculoskeletal: Normal range of motion.  Neurological: She is alert and oriented to person, place, and time.  Skin: Skin is warm.     UC Treatments / Results  Labs (all labs ordered are listed, but only abnormal results are displayed) Labs Reviewed  HSV CULTURE AND TYPING    EKG  EKG Interpretation None       Radiology No results found.  Procedures Procedures (including critical care time)  Medications Ordered in UC Medications - No data to display   Initial Impression / Assessment and Plan / UC Course  I have reviewed the triage vital signs and the nursing notes.  Pertinent labs & imaging results that were available during my care of the patient were reviewed by me and considered in my medical decision making (see chart for details).    per patient request will culture the mouth though with the ulcers opened not sure going to get a good representation of good culture she may have to have blood work drawn later that we'll try to culture her upper and lower come will place on Valtrex 2 tablets twice a day.    Final Clinical Impressions(s) / UC Diagnoses   Final diagnoses:  Fever blister    New Prescriptions New  Prescriptions   VALACYCLOVIR (VALTREX) 1000 MG TABLET    Take 2 tablets (2,000 mg total) by mouth 2 (two) times daily.    Note: This dictation was prepared with Dragon dictation along with smaller phrase technology. Any transcriptional errors that result from this process  are unintentional.   Hassan Rowan, MD 06/25/16 1904

## 2016-06-28 LAB — HSV CULTURE AND TYPING

## 2016-07-15 ENCOUNTER — Ambulatory Visit (INDEPENDENT_AMBULATORY_CARE_PROVIDER_SITE_OTHER): Payer: Federal, State, Local not specified - PPO | Admitting: Family Medicine

## 2016-07-15 ENCOUNTER — Encounter: Payer: Self-pay | Admitting: Family Medicine

## 2016-07-15 VITALS — BP 124/70 | HR 64 | Ht 64.0 in | Wt 134.0 lb

## 2016-07-15 DIAGNOSIS — B002 Herpesviral gingivostomatitis and pharyngotonsillitis: Secondary | ICD-10-CM

## 2016-07-15 DIAGNOSIS — Z8639 Personal history of other endocrine, nutritional and metabolic disease: Secondary | ICD-10-CM

## 2016-07-15 DIAGNOSIS — Z8673 Personal history of transient ischemic attack (TIA), and cerebral infarction without residual deficits: Secondary | ICD-10-CM | POA: Diagnosis not present

## 2016-07-15 DIAGNOSIS — F339 Major depressive disorder, recurrent, unspecified: Secondary | ICD-10-CM

## 2016-07-15 DIAGNOSIS — M722 Plantar fascial fibromatosis: Secondary | ICD-10-CM | POA: Diagnosis not present

## 2016-07-15 MED ORDER — VALACYCLOVIR HCL 1 G PO TABS: 2000.0000 mg | ORAL_TABLET | Freq: Two times a day (BID) | ORAL | 5 refills | Status: DC | PRN

## 2016-07-15 MED ORDER — VALACYCLOVIR HCL 1 G PO TABS: 2000.0000 mg | ORAL_TABLET | Freq: Two times a day (BID) | ORAL | 5 refills | Status: DC

## 2016-07-16 ENCOUNTER — Other Ambulatory Visit: Payer: Self-pay | Admitting: Family Medicine

## 2016-07-16 DIAGNOSIS — M722 Plantar fascial fibromatosis: Secondary | ICD-10-CM | POA: Insufficient documentation

## 2016-07-16 DIAGNOSIS — F329 Major depressive disorder, single episode, unspecified: Secondary | ICD-10-CM | POA: Insufficient documentation

## 2016-07-16 DIAGNOSIS — F32A Depression, unspecified: Secondary | ICD-10-CM | POA: Insufficient documentation

## 2016-07-16 DIAGNOSIS — M797 Fibromyalgia: Secondary | ICD-10-CM | POA: Insufficient documentation

## 2016-07-16 DIAGNOSIS — B002 Herpesviral gingivostomatitis and pharyngotonsillitis: Secondary | ICD-10-CM | POA: Insufficient documentation

## 2016-07-16 MED ORDER — VALACYCLOVIR HCL 1 G PO TABS: 2000.0000 mg | ORAL_TABLET | Freq: Two times a day (BID) | ORAL | 5 refills | Status: DC | PRN

## 2016-07-16 NOTE — Progress Notes (Signed)
Date:  07/15/2016   Name:  Kathy RobinsBelinda L Kennedy   DOB:  1955/12/07   MRN:  [id number]  PCP:  Schuyler AmorPlonk, Cohen Boettner, MD    Chief Complaint: Follow-up (4 week follow up)   History of Present Illness:  This is a 61 y.o. female seen for two month f/u. Plantar fasciitis resolved with OTC arch supports. Celexa helping with mood. Lipids ok and B12 normal last visit. C/o recurrent cold sores, seen [facility] last month and given Valtrex which helped.  Review of Systems:  Review of Systems  Constitutional: Negative for chills and fever.  Respiratory: Negative for cough and shortness of breath.   Cardiovascular: Negative for chest pain and leg swelling.  Genitourinary: Negative for difficulty urinating.  Neurological: Negative for syncope and light-headedness.    Patient Active Problem List   Diagnosis Date Noted  . Depression 07/16/2016  . Plantar fasciitis 07/16/2016  . Fibromyalgia 07/16/2016  . 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  . Anxiety 05/08/2016  . Hx of hyperlipidemia 05/08/2016  . Allergic rhinitis 03/12/2016  . Chronic right shoulder pain 08/30/2015  . H/O transient cerebral ischemia 05/26/2014  . Hoarse 05/26/2014  . 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 Tendinitis of wrist 05/26/2014    Prior to Admission medications   Medication Sig Start Date End Date Taking? Authorizing Provider  aspirin 81 MG tablet Take 1 tablet (81 mg total) by mouth daily. 05/08/16  Yes Wafaa Deemer, Chrissie NoaWilliam, MD  citalopram (CELEXA) 20 MG tablet Take 1 tablet (20 mg total) by mouth daily. 05/08/16  Yes Arelia Volpe, Chrissie NoaWilliam, MD  Misc Natural Products (GINSENG-AMERICAN PO) Take by mouth.   Yes [provider]  valACYclovir (VALTREX) 1000 MG tablet Take 2 tablets (2,000 mg total) by mouth 2 (two) times daily as needed. 07/15/16   Schuyler AmorPlonk, Dashawn Golda, MD    Allergies  Allergen Reactions  . Molds & Smuts Other (See Comments)    sneezing    No past surgical history on file.  Social History  Substance Use  Topics  . Smoking status: Former Smoker    Packs/day: 1.00    Years: 10.00    Quit date: 02/04/1984  . Smokeless tobacco: Never Used  . Alcohol use No    Family History  Problem Relation Age of Onset  . Hyperlipidemia Mother   . Heart attack Father     Medication list has been reviewed and updated.  Physical Examination: BP 124/70   Pulse 64   Ht 5\' 4"  (1.626 m)   Wt 134 lb (60.8 kg)   LMP 02/03/2013   BMI 23.00 kg/m   Physical Exam  Constitutional: She appears well-developed and well-nourished.  Cardiovascular: Normal rate, regular rhythm and normal heart sounds.   Pulmonary/Chest: Effort normal and breath sounds normal.  Musculoskeletal: She exhibits no edema.  Neurological: She is alert.  Skin: Skin is warm and dry.  Psychiatric: She has a normal mood and affect. Her behavior is normal.  Nursing note and vitals reviewed.   Assessment and Plan:  1. Recurrent major depressive disorder, remission status unspecified (HCC) Improved on Celexa, continue  2. Recurrent oral herpes simplex Valtrex 2 gm bid x 1d prn, begin at outbreak onset, consider suppressive therapy if frequent/severe  3. Plantar fasciitis Resolved with OTC inserts  4. H/O transient cerebral ischemia Cont asa  5. Hx of hyperlipidemia Lipids ok, hold statin given hx fibromyalgia (10 yr CVR 3%)  Return in about 6 months (around 01/14/2017).  Dionne AnoWilliam M. Kingsley SpittlePlonk, Jr. MD [facility]  07/16/2016

## 2016-08-19 DIAGNOSIS — T679XXA Effect of heat and light, unspecified, initial encounter: Secondary | ICD-10-CM | POA: Diagnosis not present

## 2016-08-19 DIAGNOSIS — Z87891 Personal history of nicotine dependence: Secondary | ICD-10-CM | POA: Diagnosis not present

## 2016-08-19 DIAGNOSIS — R1033 Periumbilical pain: Secondary | ICD-10-CM | POA: Diagnosis not present

## 2016-08-19 DIAGNOSIS — R1013 Epigastric pain: Secondary | ICD-10-CM | POA: Diagnosis not present

## 2016-08-19 DIAGNOSIS — R000 Tachycardia, unspecified: Secondary | ICD-10-CM | POA: Diagnosis not present

## 2016-11-25 ENCOUNTER — Other Ambulatory Visit: Payer: Self-pay

## 2016-11-25 ENCOUNTER — Encounter: Payer: Self-pay | Admitting: Family Medicine

## 2016-11-25 ENCOUNTER — Ambulatory Visit (INDEPENDENT_AMBULATORY_CARE_PROVIDER_SITE_OTHER): Payer: Federal, State, Local not specified - PPO | Admitting: Family Medicine

## 2016-11-25 VITALS — BP 120/82 | HR 72 | Resp 16 | Ht 64.0 in | Wt 134.8 lb

## 2016-11-25 DIAGNOSIS — R1012 Left upper quadrant pain: Secondary | ICD-10-CM | POA: Diagnosis not present

## 2016-11-25 DIAGNOSIS — Z8673 Personal history of transient ischemic attack (TIA), and cerebral infarction without residual deficits: Secondary | ICD-10-CM

## 2016-11-25 DIAGNOSIS — M545 Low back pain, unspecified: Secondary | ICD-10-CM

## 2016-11-25 DIAGNOSIS — F3342 Major depressive disorder, recurrent, in full remission: Secondary | ICD-10-CM

## 2016-11-25 DIAGNOSIS — M797 Fibromyalgia: Secondary | ICD-10-CM

## 2016-11-25 DIAGNOSIS — Z23 Encounter for immunization: Secondary | ICD-10-CM

## 2016-11-25 NOTE — Progress Notes (Signed)
Date:  11/25/2016   Name:  Kathy RobinsBelinda L Kennedy   DOB:  December 28, 1955   MRN:  [id number]  PCP:  Schuyler AmorPlonk, Kathy Lorman, MD    Chief Complaint: Back Pain (No injury- muscle like pain in middle of back x 1 month. )   History of Present Illness:  This is a 61 y.o. female seen for urgent visit. C/o L LBP x 1 month, no known injury, no leg pain, Advil helps some. Radiates to LUQ, feels full, occ wakes in am. Having occ joint aches which Advil helps. Depression off Celexa doing well. On asa for hx TIA.  Review of Systems:  Review of Systems  Constitutional: Negative for chills and fever.  Respiratory: Negative for cough and shortness of breath.   Cardiovascular: Negative for chest pain and leg swelling.  Gastrointestinal: Negative for constipation, diarrhea, nausea and vomiting.  Genitourinary: Negative for dysuria.  Musculoskeletal: Negative for joint swelling.  Neurological: Negative for syncope and light-headedness.    Patient Active Problem List   Diagnosis Date Noted  . Depression 07/16/2016  . Plantar fasciitis 07/16/2016  . Fibromyalgia 07/16/2016  . 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  . Anxiety 05/08/2016  . Hx of hyperlipidemia 05/08/2016  . Allergic rhinitis 03/12/2016  . Chronic right shoulder pain 08/30/2015  . H/O transient cerebral ischemia 05/26/2014  . Hoarse 05/26/2014  . 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 Tendinitis of wrist 05/26/2014    Prior to Admission medications   Medication Sig Start Date End Date Taking? Authorizing Provider  aspirin 81 MG tablet Take 1 tablet (81 mg total) by mouth daily. 05/08/16  Yes Mahayla Haddaway, Chrissie NoaWilliam, MD  Ibuprofen (ADVIL) 200 MG CAPS Take 2 capsules by mouth every 4 (four) hours as needed.   Yes [provider]    Allergies  Allergen Reactions  . Molds & Smuts Other (See Comments)    sneezing    History reviewed. No pertinent surgical history.  Social History  Substance Use Topics  . Smoking status: Former  Smoker    Packs/day: 1.00    Years: 10.00    Quit date: 02/04/1984  . Smokeless tobacco: Never Used  . Alcohol use No    Family History  Problem Relation Age of Onset  . Hyperlipidemia Mother   . Heart attack Father     Medication list has been reviewed and updated.  Physical Examination: BP 120/82   Pulse 72   Resp 16   Ht 5\' 4"  (1.626 m)   Wt 134 lb 12.8 oz (61.1 kg)   LMP 02/03/2013   SpO2 98%   BMI 23.14 kg/m   Physical Exam  Constitutional: She appears well-developed and well-nourished.  Cardiovascular: Normal rate, regular rhythm and normal heart sounds.   Pulmonary/Chest: Effort normal and breath sounds normal.  Abdominal: Soft. She exhibits no distension and no mass. There is no rebound and no guarding.  Moderate LUQ tenderness  Musculoskeletal: She exhibits no edema.  Negative L SLR, pain with hip rotation  Neurological: She is alert.  Skin: Skin is warm and dry.  Psychiatric: Her behavior is normal.  Sl flat affect  Nursing note and vitals reviewed.   Assessment and Plan:  1. Left-sided low back pain without sciatica, unspecified chronicity UA negative, may be fibromyalgia flare - POCT urinalysis dipstick  2. Left upper quadrant pain Check labs, US, consider CT if sxs persist - US Abdomen Complete; Future - Comprehensive Metabolic Panel (CMET) - CBC  3. Recurrent major depressive disorder, in full  remission (HCC) Reports mood ok off Celexa  4. Fibromyalgia Consider gabapentin or Cymbalta  5. H/O transient cerebral ischemia Cont asa  6. Need for influenza vaccination - Flu Vaccine QUAD 6+ mos PF IM (Fluarix Quad PF)  Return if symptoms worsen or fail to improve.  Dionne Ano. Kingsley Spittle MD [facility]  11/25/2016

## 2016-11-25 NOTE — Patient Instructions (Signed)

## 2016-11-26 LAB — CBC
HEMATOCRIT: 42.6 % (ref 34.0–46.6)
Hemoglobin: 13.7 g/dL (ref 11.1–15.9)
MCH: 29.4 pg (ref 26.6–33.0)
MCHC: 32.2 g/dL (ref 31.5–35.7)
MCV: 91 fL (ref 79–97)
PLATELETS: 268 10*3/uL (ref 150–379)
RBC: 4.66 x10E6/uL (ref 3.77–5.28)
RDW: 13.8 % (ref 12.3–15.4)
WBC: 5.6 10*3/uL (ref 3.4–10.8)

## 2016-11-26 LAB — POCT URINALYSIS DIPSTICK
BILIRUBIN UA: NEGATIVE
GLUCOSE UA: NEGATIVE
Ketones, UA: NEGATIVE
LEUKOCYTES UA: NEGATIVE
NITRITE UA: NEGATIVE
Protein, UA: NEGATIVE
RBC UA: NEGATIVE
Spec Grav, UA: 1.01 (ref 1.010–1.025)
UROBILINOGEN UA: 0.2 U/dL
pH, UA: 6 (ref 5.0–8.0)

## 2016-11-26 LAB — COMPREHENSIVE METABOLIC PANEL
ALBUMIN: 4.5 g/dL (ref 3.6–4.8)
ALT: 20 IU/L (ref 0–32)
AST: 23 IU/L (ref 0–40)
Albumin/Globulin Ratio: 1.9 (ref 1.2–2.2)
Alkaline Phosphatase: 66 IU/L (ref 39–117)
BUN / CREAT RATIO: 19 (ref 12–28)
BUN: 12 mg/dL (ref 8–27)
Bilirubin Total: 0.3 mg/dL (ref 0.0–1.2)
CALCIUM: 9.4 mg/dL (ref 8.7–10.3)
CO2: 25 mmol/L (ref 20–29)
CREATININE: 0.64 mg/dL (ref 0.57–1.00)
Chloride: 102 mmol/L (ref 96–106)
GFR calc Af Amer: 111 mL/min/{1.73_m2} (ref >59)
GFR, EST NON AFRICAN AMERICAN: 97 mL/min/{1.73_m2} (ref >59)
GLOBULIN, TOTAL: 2.4 g/dL (ref 1.5–4.5)
GLUCOSE: 60 mg/dL — AB (ref 65–99)
Potassium: 4.6 mmol/L (ref 3.5–5.2)
SODIUM: 144 mmol/L (ref 134–144)
TOTAL PROTEIN: 6.9 g/dL (ref 6.0–8.5)

## 2016-11-27 ENCOUNTER — Ambulatory Visit
Admission: RE | Admit: 2016-11-27 | Discharge: 2016-11-27 | Disposition: A | Discharge status: Home / Self Care | Payer: Federal, State, Local not specified - PPO | Source: Ambulatory Visit | Attending: Family Medicine | Admitting: Family Medicine

## 2016-11-27 ENCOUNTER — Encounter: Payer: Self-pay | Admitting: Family Medicine

## 2016-11-27 DIAGNOSIS — R1012 Left upper quadrant pain: Secondary | ICD-10-CM | POA: Insufficient documentation

## 2016-11-27 DIAGNOSIS — R109 Unspecified abdominal pain: Secondary | ICD-10-CM | POA: Insufficient documentation

## 2016-11-27 DIAGNOSIS — R1011 Right upper quadrant pain: Secondary | ICD-10-CM | POA: Diagnosis not present

## 2016-11-29 LAB — SPECIMEN STATUS REPORT

## 2016-11-29 LAB — LIPASE: Lipase: 32 U/L (ref 14–72)

## 2016-12-25 ENCOUNTER — Ambulatory Visit
Admission: EM | Admit: 2016-12-25 | Discharge: 2016-12-25 | Disposition: A | Discharge status: Home / Self Care | Payer: Federal, State, Local not specified - PPO | Attending: Family Medicine | Admitting: Family Medicine

## 2016-12-25 ENCOUNTER — Encounter: Payer: Self-pay | Admitting: *Deleted

## 2016-12-25 ENCOUNTER — Other Ambulatory Visit: Payer: Self-pay

## 2016-12-25 DIAGNOSIS — J069 Acute upper respiratory infection, unspecified: Secondary | ICD-10-CM | POA: Diagnosis not present

## 2016-12-25 DIAGNOSIS — R05 Cough: Secondary | ICD-10-CM | POA: Diagnosis not present

## 2016-12-25 MED ORDER — BENZONATATE 200 MG PO CAPS: ORAL_CAPSULE | ORAL | 0 refills | Status: DC

## 2016-12-25 MED ORDER — ALBUTEROL SULFATE HFA 108 (90 BASE) MCG/ACT IN AERS: 1.0000 | INHALATION_SPRAY | Freq: Four times a day (QID) | RESPIRATORY_TRACT | 0 refills | Status: DC | PRN

## 2016-12-25 MED ORDER — AZITHROMYCIN 250 MG PO TABS: 250.0000 mg | ORAL_TABLET | Freq: Every day | ORAL | 0 refills | Status: DC

## 2016-12-25 NOTE — ED Provider Notes (Signed)
[facility]    CSN: [id number] Arrival date & time: 12/25/16  1814     History   Chief Complaint Chief Complaint  Patient presents with  . Cough  . Nasal Congestion    HPI Kathy Kennedy is a 61 y.o. female.   HPI 61 year old female who is accompanied by her husband complaining of 2-week history cough chest congestion and nasal congestion.  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  She states that by the end of the day her breathing is somewhat better but at the beginning of the day is very tight and restricted.  Been coughing up sputum which is mostly whitish to clear.  He has had no fever or chills.  Previous cigarette smoker 40 years ago.  No fever or chills.  O2 sats today are 97% on room air.  Temperature 97.7      Past Medical History:  Diagnosis Date  . Stroke [facility])    3 years ago (mini stroke)    Patient Active Problem List   Diagnosis Date Noted  . Depression 07/16/2016  . Plantar fasciitis 07/16/2016  . Fibromyalgia 07/16/2016  . 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  . Anxiety 05/08/2016  . Hx of hyperlipidemia 05/08/2016  . Allergic rhinitis 03/12/2016  . Chronic right shoulder pain 08/30/2015  . H/O transient cerebral ischemia 05/26/2014  . Hoarse 05/26/2014  . 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 Tendinitis of wrist 05/26/2014    History reviewed. No pertinent surgical history.  OB History    Gravida Para Term Preterm AB Living   0 0 0 0 0 0   SAB TAB Ectopic Multiple Live Births   0 0 0 0         Home Medications    Prior to Admission medications   Medication Sig Start Date End Date Taking? Authorizing Provider  albuterol (PROVENTIL HFA;VENTOLIN HFA) 108 (90 Base) MCG/ACT inhaler Inhale 1-2 puffs into the lungs every 6 (six) hours as needed for wheezing or shortness of breath. Use with spacer 12/25/16   Lutricia Feil, PA-C  azithromycin (ZITHROMAX) 250 MG tablet Take 1 tablet (250  mg total) by mouth daily. Take first 2 tablets together, then 1 every day until finished. 12/25/16   Lutricia Feil, PA-C  benzonatate (TESSALON) 200 MG capsule Take one cap TID PRN cough 12/25/16   Lutricia Feil, PA-C    Family History Family History  Problem Relation Age of Onset  . Hyperlipidemia Mother   . Heart attack Father     Social History Social History   Tobacco Use  . Smoking status: Former Smoker    Packs/day: 1.00    Years: 10.00    Pack years: 10.00    Last attempt to quit: 02/04/1984    Years since quitting: 32.9  . Smokeless tobacco: Never Used  Substance Use Topics  . Alcohol use: No  . Drug use: No     Allergies   Molds & smuts   Review of Systems Review of Systems  Constitutional: Positive for fatigue. Negative for activity change, chills and fever.  HENT: Positive for congestion and rhinorrhea.   Respiratory: Positive for cough and shortness of breath. Negative for wheezing and stridor.   All other systems reviewed and are negative.    Physical Exam Triage Vital Signs ED Triage Vitals  Enc Vitals Group     BP 12/25/16 1849 (!) 144/93     Pulse Rate 12/25/16 1849  78     Resp 12/25/16 1849 16     Temp 12/25/16 1849 97.7 F (36.5 C)     Temp Source 12/25/16 1849 Oral     SpO2 12/25/16 1849 97 %     Weight 12/25/16 1852 140 lb (63.5 kg)     Height 12/25/16 1852 5\' 4"  (1.626 m)     Head Circumference --      Peak Flow --      Pain Score 12/25/16 1852 0     Pain Loc --      Pain Edu? --      Excl. in GC? --    No data found.  Updated Vital Signs BP (!) 144/93 (BP Location: Left Arm)   Pulse 78   Temp 97.7 F (36.5 C) (Oral)   Resp 16   Ht 5\' 4"  (1.626 m)   Wt 140 lb (63.5 kg)   LMP 02/03/2013   SpO2 97%   BMI 24.03 kg/m   Visual Acuity Right Eye Distance:   Left Eye Distance:   Bilateral Distance:    Right Eye Near:   Left Eye Near:    Bilateral Near:     Physical Exam  Constitutional: She is oriented to  person, place, and time. She appears well-developed and well-nourished. No distress.  HENT:  Head: Normocephalic.  Right Ear: External ear normal.  Left Ear: External ear normal.  Nose: Nose normal.  Mouth/Throat: Oropharynx is clear and moist. No oropharyngeal exudate.  Eyes: Pupils are equal, round, and reactive to light. Right eye exhibits no discharge. Left eye exhibits no discharge.  Neck: Normal range of motion.  Pulmonary/Chest: Effort normal and breath sounds normal. No stridor. No respiratory distress. She has no wheezes. She has no rales.  Musculoskeletal: Normal range of motion.  Lymphadenopathy:    She has no cervical adenopathy.  Neurological: She is alert and oriented to person, place, and time.  Skin: Skin is warm and dry. She is not diaphoretic.  Psychiatric: She has a normal mood and affect. Her behavior is normal. Judgment and thought content normal.  Nursing note and vitals reviewed.    UC Treatments / Results  Labs (all labs ordered are listed, but only abnormal results are displayed) Labs Reviewed - No data to display  EKG  EKG Interpretation None       Radiology No results found.  Procedures Procedures (including critical care time)  Medications Ordered in UC Medications - No data to display   Initial Impression / Assessment and Plan / UC Course  I have reviewed the triage vital signs and the nursing notes.  Pertinent labs & imaging results that were available during my care of the patient were reviewed by me and considered in my medical decision making (see chart for details).     Plan: 1. Test/x-ray results and diagnosis reviewed with patient 2. rx as per orders; risks, benefits, potential side effects reviewed with patient 3. Recommend supportive treatment with fluids and rest as necessary.  Albuterol for shortness of breath.  Tessalon Perles for daytime use.  Prefers to use NyQuil for cough at nighttime.  She is not doing better she should  follow-up with her primary care physician Dr. Hollace HaywardPlonk. 4. F/u prn if symptoms worsen or don't improve   Final Clinical Impressions(s) / UC Diagnoses   Final diagnoses:  Acute upper respiratory infection    ED Discharge Orders        Ordered    albuterol (PROVENTIL HFA;VENTOLIN  HFA) 108 (90 Base) MCG/ACT inhaler  Every 6 hours PRN    Comments:  Provide spacer and instructions to patient   12/25/16 1936    azithromycin (ZITHROMAX) 250 MG tablet  Daily     12/25/16 1936    benzonatate (TESSALON) 200 MG capsule     12/25/16 1936       Controlled Substance Prescriptions [place] Controlled Substance Registry consulted? Not Applicable   Lutricia FeilRoemer, Dee Paden P, PA-C 12/25/16 1954

## 2016-12-25 NOTE — ED Triage Notes (Signed)
Patient started having symptoms of cough, chest congestion, and nasal congestion.

## 2017-01-07 ENCOUNTER — Encounter: Payer: Self-pay | Admitting: Family Medicine

## 2017-01-07 ENCOUNTER — Ambulatory Visit: Payer: Federal, State, Local not specified - PPO | Admitting: Family Medicine

## 2017-01-07 VITALS — BP 118/76 | HR 67 | Temp 97.6°F | Resp 16 | Ht 64.0 in | Wt 133.8 lb

## 2017-01-07 DIAGNOSIS — Z283 Underimmunization status: Secondary | ICD-10-CM | POA: Diagnosis not present

## 2017-01-07 DIAGNOSIS — J40 Bronchitis, not specified as acute or chronic: Secondary | ICD-10-CM

## 2017-01-07 DIAGNOSIS — F3342 Major depressive disorder, recurrent, in full remission: Secondary | ICD-10-CM | POA: Diagnosis not present

## 2017-01-07 DIAGNOSIS — Z289 Immunization not carried out for unspecified reason: Secondary | ICD-10-CM

## 2017-01-07 DIAGNOSIS — Z8673 Personal history of transient ischemic attack (TIA), and cerebral infarction without residual deficits: Secondary | ICD-10-CM

## 2017-01-07 NOTE — Progress Notes (Signed)
Date:  01/07/2017   Name:  Kathy RobinsBelinda L Hine   DOB:  12-24-1955   MRN:  [id number]  PCP:  Schuyler AmorPlonk, Rhen Dossantos, MD    Chief Complaint: Cough (2-3 weeks of cold symptoms. )   History of Present Illness:  This is a 61 y.o. female seen in [facility] 2 weeks ago for acute URI, placed on Zpak, still having cough but slowly improving. Back and abdominal pain resolved, mood ok off Celexa, not taking asa regularly, confirmed MD dx of TIA.  Review of Systems:  Review of Systems  Constitutional: Negative for chills and fever.  Respiratory: Negative for shortness of breath.   Cardiovascular: Negative for chest pain and leg swelling.  Genitourinary: Negative for difficulty urinating.  Neurological: Negative for syncope and light-headedness.    Patient Active Problem List   Diagnosis Date Noted  . Depression 07/16/2016  . Plantar fasciitis 07/16/2016  . Fibromyalgia 07/16/2016  . 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  . Anxiety 05/08/2016  . Hx of hyperlipidemia 05/08/2016  . Allergic rhinitis 03/12/2016  . Chronic right shoulder pain 08/30/2015  . H/O transient cerebral ischemia 05/26/2014  . Hoarse 05/26/2014  . 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 Tendinitis of wrist 05/26/2014    Prior to Admission medications   Medication Sig Start Date End Date Taking? Authorizing Provider  albuterol (PROVENTIL HFA;VENTOLIN HFA) 108 (90 Base) MCG/ACT inhaler Inhale 1-2 puffs into the lungs every 6 (six) hours as needed for wheezing or shortness of breath. Use with spacer 12/25/16  Yes Lutricia FeilRoemer, Ziggy Reveles P, PA-C  aspirin 81 MG tablet Take 81 mg by mouth daily.   Yes [provider]  benzonatate (TESSALON) 200 MG capsule Take one cap TID PRN cough 12/25/16  Yes Lutricia Feiloemer, Zeyna Mkrtchyan P, PA-C    Allergies  Allergen Reactions  . Molds & Smuts Other (See Comments)    sneezing    History reviewed. No pertinent surgical history.  Social History   Tobacco Use  . Smoking status: Former Smoker     Packs/day: 1.00    Years: 10.00    Pack years: 10.00    Last attempt to quit: 02/04/1984    Years since quitting: 32.9  . Smokeless tobacco: Never Used  Substance Use Topics  . Alcohol use: No  . Drug use: No    Family History  Problem Relation Age of Onset  . Hyperlipidemia Mother   . Heart attack Father     Medication list has been reviewed and updated.  Physical Examination: BP 118/76   Pulse 67   Temp 97.6 F (36.4 C)   Resp 16   Ht 5\' 4"  (1.626 m)   Wt 133 lb 12.8 oz (60.7 kg)   LMP 02/03/2013   SpO2 97%   BMI 22.97 kg/m   Physical Exam  Constitutional: She appears well-developed and well-nourished. No distress.  HENT:  Mouth/Throat: Oropharynx is clear and moist.  Neck: Neck supple.  Cardiovascular: Normal rate, regular rhythm and normal heart sounds.  Pulmonary/Chest: Effort normal and breath sounds normal.  Musculoskeletal: She exhibits no edema.  Lymphadenopathy:    She has no cervical adenopathy.  Neurological: She is alert.  Skin: Skin is warm and dry.  Psychiatric: She has a normal mood and affect. Her behavior is normal.  Nursing note and vitals reviewed.   Assessment and Plan:  1. Bronchitis, persistent Improving s/p Zpak, advised cough is usually last sx to resolve, monitor  2. Recurrent major depressive disorder, in full remission (HCC) Stable  off Celexa  3. H/O transient cerebral ischemia Restart asa daily  4. Delayed immunizations Unsure when last tetanus imm, will check records, declines mammo/colonoscopy  Return if symptoms worsen or fail to improve.  Dionne AnoWilliam M. Kingsley SpittlePlonk, Jr. MD [facility]  01/07/2017

## 2017-03-06 DIAGNOSIS — I10 Essential (primary) hypertension: Secondary | ICD-10-CM | POA: Diagnosis not present

## 2017-03-06 DIAGNOSIS — R51 Headache: Secondary | ICD-10-CM | POA: Diagnosis not present

## 2017-03-28 ENCOUNTER — Encounter: Payer: Self-pay | Admitting: Family Medicine

## 2017-03-28 ENCOUNTER — Ambulatory Visit: Payer: Federal, State, Local not specified - PPO | Admitting: Family Medicine

## 2017-03-28 VITALS — BP 122/82 | HR 88 | Temp 97.6°F | Resp 16 | Ht 64.0 in | Wt 133.4 lb

## 2017-03-28 DIAGNOSIS — Z23 Encounter for immunization: Secondary | ICD-10-CM

## 2017-03-28 DIAGNOSIS — Z8673 Personal history of transient ischemic attack (TIA), and cerebral infarction without residual deficits: Secondary | ICD-10-CM

## 2017-03-28 DIAGNOSIS — F3342 Major depressive disorder, recurrent, in full remission: Secondary | ICD-10-CM | POA: Diagnosis not present

## 2017-03-28 DIAGNOSIS — R490 Dysphonia: Secondary | ICD-10-CM | POA: Diagnosis not present

## 2017-03-28 NOTE — Progress Notes (Signed)
Date:  03/28/2017   Name:  Kathy Kennedy   DOB:  April 26, 1955   MRN:  [id number]  PCP:  Schuyler AmorPlonk, Emmakate Hypes, MD    Chief Complaint: Hoarse (Atarted using nasal patches to help with sleeping with open mouth and choking but that has been for years. Lost voice after coughing for a few weeks. Tried home remedies including Honey. and Ginger tea along with several other herbals. )   History of Present Illness:  This is a 62 y.o. female seen for 3 week hx hoarseness, not improving. Occ NP cough/chest congestion. Saw ENT for similar sxs in 2016, dx'd with R vocal cord paralysis, saw speech therapy for several weeks with complete resolution of sxs. Denies sign sore throiat, heartburn, or allergy sxs. Depression sxs stable off Celexa. H/o TIA, not taking daily asa. Thinks last tet imm about 10 yrs ago.   Review of Systems:  Review of Systems  Constitutional: Negative for chills and fever.  HENT: Negative for ear pain, rhinorrhea, sinus pressure, sore throat and trouble swallowing.   Respiratory: Negative for shortness of breath.   Cardiovascular: Negative for chest pain and leg swelling.  Genitourinary: Negative for difficulty urinating.  Neurological: Negative for syncope and light-headedness.    Patient Active Problem List   Diagnosis Date Noted  . Depression 07/16/2016  . Plantar fasciitis 07/16/2016  . Fibromyalgia 07/16/2016  . 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  . Anxiety 05/08/2016  . Hx of hyperlipidemia 05/08/2016  . Allergic rhinitis 03/12/2016  . Chronic right shoulder pain 08/30/2015  . H/O transient cerebral ischemia 05/26/2014  . Hoarse 05/26/2014  . 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 Tendinitis of wrist 05/26/2014    Prior to Admission medications   Medication Sig Start Date End Date Taking? Authorizing Provider  aspirin 81 MG tablet Take 81 mg by mouth daily.   Yes [provider]    Allergies  Allergen Reactions  . Molds & Smuts Other  (See Comments)    sneezing    History reviewed. No pertinent surgical history.  Social History   Tobacco Use  . Smoking status: Former Smoker    Packs/day: 1.00    Years: 10.00    Pack years: 10.00    Last attempt to quit: 02/04/1984    Years since quitting: 33.1  . Smokeless tobacco: Never Used  Substance Use Topics  . Alcohol use: No  . Drug use: No    Family History  Problem Relation Age of Onset  . Hyperlipidemia Mother   . Heart attack Father     Medication list has been reviewed and updated.  Physical Examination: BP 122/82   Pulse 88   Temp 97.6 F (36.4 C) (Oral)   Resp 16   Ht 5\' 4"  (1.626 m)   Wt 133 lb 6.4 oz (60.5 kg)   LMP 02/03/2013   SpO2 99%   BMI 22.90 kg/m   Physical Exam  Constitutional: She appears well-developed and well-nourished.  HENT:  Nose: Nose normal.  Mouth/Throat: No oropharyngeal exudate.  OP mild erythema  Neck: Neck supple. No thyromegaly present.  Pulmonary/Chest: Effort normal and breath sounds normal.  Musculoskeletal: She exhibits no edema.  Lymphadenopathy:    She has no cervical adenopathy.  Neurological: She is alert.  Skin: Skin is warm and dry.  Psychiatric: She has a normal mood and affect. Her behavior is normal.  Nursing note and vitals reviewed.   Assessment and Plan:  1. Hoarseness, persistent May be due to  allergic rhinitis but hx of vocal cord paralysis 2016 - Ambulatory referral to ENT  2. H/O transient cerebral ischemia Resume asa 81 mg daily  3. Recurrent major depressive disorder, in full remission (HCC) Stable off Celexa  4. Need for diphtheria-tetanus-pertussis (Tdap) vaccine - Tdap vaccine greater than or equal to 7yo IM  Return in about 6 months (around 09/25/2017).  Dionne Ano. Kingsley Spittle MD [facility]  03/28/2017

## 2017-04-21 DIAGNOSIS — R490 Dysphonia: Secondary | ICD-10-CM | POA: Diagnosis not present

## 2017-04-21 DIAGNOSIS — J3801 Paralysis of vocal cords and larynx, unilateral: Secondary | ICD-10-CM | POA: Diagnosis not present

## 2017-04-29 ENCOUNTER — Ambulatory Visit: Payer: Federal, State, Local not specified - PPO | Attending: Unknown Physician Specialty | Admitting: Speech Pathology

## 2017-04-29 ENCOUNTER — Other Ambulatory Visit: Payer: Self-pay

## 2017-04-29 ENCOUNTER — Encounter: Payer: Self-pay | Admitting: Speech Pathology

## 2017-04-29 DIAGNOSIS — R490 Dysphonia: Secondary | ICD-10-CM | POA: Diagnosis not present

## 2017-04-29 NOTE — Therapy (Signed)
[facility] [address] [place], [place], [address] Phone: [phone]   Fax:  [phone]  Speech Language Pathology Evaluation  Patient Details  Name: Kathy RobinsBelinda L Tanksley MRN: [id number] Date of Birth: 04/05/1955 Referring Provider: Dr. Jenne CampusMcQueen   Encounter Date: 04/29/2017  End of Session - 04/29/17 1528    Visit Number  1    Number of Visits  17    Date for SLP Re-Evaluation  06/29/17    SLP Start Time  1400    SLP Stop Time   1454    SLP Time Calculation (min)  54 min    Activity Tolerance  Patient tolerated treatment well       Past Medical History:  Diagnosis Date   Stroke [facility](HCC)    3 years ago (mini stroke)    History reviewed. No pertinent surgical history.  There were no vitals filed for this visit.      SLP Evaluation [facility] - 04/29/17 1528      SLP Visit Information   SLP Received On  04/29/17    Referring Provider  Dr. Jenne CampusMcQueen    Onset Date  04/21/2017    Medical Diagnosis  right vocal cord paralysis      Subjective   Subjective   "Everyone asks me what's wrong with my voice, have I seen a doctor"    Patient/Family Stated Goal  Return to strong voice      Pain Assessment   Currently in Pain?  No/denies      General Information   HPI  [age] year old woman, with right vocal cord paralysis and successful voice therapy in 2016, referred by Dr. Jenne CampusMcQueen for voice therapy.  Per report, abnormal laryngeal findings include: right vocal cord paralysis unchanged from years ago fairly well medialized, left cord not quite coming over to       Prior Functional Status   Cognitive/Linguistic Baseline  Within functional limits      Oral Motor/Sensory Function   Overall Oral Motor/Sensory Function  Appears within functional limits for tasks assessed      Motor Speech   Overall Motor Speech  Impaired    Respiration  Impaired    Level of Impairment  Conversation    Phonation  Breathy;Hoarse;Low vocal intensity     Resonance  Within functional limits    Articulation  Within functional limitis    Intelligibility  Intelligible    Motor Planning  Witnin functional limits    Phonation  Impaired    Vocal Abuses  Habitual Hyperphonia    Tension Present  Jaw;Neck;Shoulder    Volume  Soft    Pitch  High      Standardized Assessments   Standardized Assessments   Other Assessment Perceptual Voice Evaluation       Perceptual Voice Evaluation Voice checklist:  Health risks: no change   Characteristic voice use: not very talkative, but uses her voice for work and family communication   Environmental risks: no significant environmental risks  Misuse: excessively high pitch  Abuse: overuse when she was congested  Vocal characteristics: breathy, hoarse, limited voice range, high pitch, poor vocal projection, excessive pharyngeal resonance Patient quality of life survey: VHI-10: 16 (A score of 10 or higher indicate voice handicap)  Patient scored 23 in 2016  Maximum phonation time for sustained ah: 14 seconds Average fundamental frequency during sustained ah: 316 Hz (2.7 STD above average for age and gender) Average time patient was able to sustain /  s/: 12.3 seconds Average time patient was able to sustain /z/: 13.3 seconds s/z ratio : 0.92 Visi-Pitch: Multi-Dimensional Voice Program (MDVP)  MDVP extracts objective quantitative values (Relative Average Perturbation, Shimmer, Voice Turbulence Index, and Noise to Harmonic Ratio) on sustained phonation, which are displayed graphically and numerically in comparison to a built-in normative database.  The patient exhibited values outside within the norm for Shimmer.  Average fundamental frequency was 2.7 STD above average for age and gender. Perceptually, her voice was muffled.  Education: Patient instructed in extrinsic laryngeal muscle stretches and resonant voice therapy  SLP Education - 04/29/17 1527    Education provided  Yes    Education Details   Voice production    Person(s) Educated  Patient    Methods  Explanation    Comprehension  Verbalized understanding         SLP Long Term Goals - 04/29/17 1530      SLP LONG TERM GOAL #1   Title  The patient will demonstrate independent understanding of vocal hygiene concepts and extrinsic laryngeal muscle stretches.      Time  8    Period  Weeks    Status  New    Target Date  06/29/17      SLP LONG TERM GOAL #3   Title  The patient will minimize vocal tension via resonant voice therapy (or comparable technique) with min SLP cues with 80% accuracy.    Time  8    Period  Weeks    Status  New    Target Date  06/29/17      SLP LONG TERM GOAL #4   Title  The patient will maximize voice quality and loudness using breath support for paragraph length recitation with 80% accuracy.    Time  8    Period  Weeks    Status  New    Target Date  06/29/17       Plan - 04/29/17 1528    Clinical Impression Statement  This 52 year woman under the care of Dr. Jenne Campus, with right vocal cord paralysis, is presenting with moderate dysphonia characterized by habitual high pitch, hoarse/breathy vocal quality, reduced breath control for speech, reduced pitch range, and intrinsic/extrinsic laryngeal tension.  The patient will benefit from voice therapy for education, to improve breath control for speech, reduce laryngeal tension, and promote relaxed phonation / oral resonance.  The patient was successful with voice therapy in 2016 and can be expected to do as well this time.    Speech Therapy Frequency  2x / week    Duration  Other (comment) 8 weeks    Treatment/Interventions  Patient/family education;SLP instruction and feedback;Functional tasks Voice therapy    Potential to Achieve Goals  Good    Potential Considerations  Ability to learn/carryover information;Cooperation/participation level;Previous level of function;Other (comment)    SLP Home Exercise Plan  extrinsic laryngeal muscle stretches and  resonant voice exercises    Consulted and Agree with Plan of Care  Patient       Patient will benefit from skilled therapeutic intervention in order to improve the following deficits and impairments:   Dysphonia - Plan: SLP plan of care cert/re-cert    Problem List Patient Active Problem List   Diagnosis Date Noted   Depression 07/16/2016   Plantar fasciitis 07/16/2016   Fibromyalgia 07/16/2016   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   Anxiety 05/08/2016   Hx of hyperlipidemia 05/08/2016   Allergic rhinitis 03/12/2016   Chronic right  shoulder pain 08/30/2015   H/O transient cerebral ischemia 05/26/2014   Hoarse 05/26/2014   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 [place], [place], [address] Phone: [phone]   Fax:  [phone]  Name: RAYLINN KOSAR MRN: [id number] Date of Birth: 1955-09-29

## 2017-05-02 ENCOUNTER — Ambulatory Visit: Payer: Federal, State, Local not specified - PPO | Admitting: Speech Pathology

## 2017-05-07 ENCOUNTER — Ambulatory Visit: Payer: Federal, State, Local not specified - PPO | Attending: Unknown Physician Specialty | Admitting: Speech Pathology

## 2017-05-07 ENCOUNTER — Encounter: Payer: Self-pay | Admitting: Speech Pathology

## 2017-05-07 DIAGNOSIS — R490 Dysphonia: Secondary | ICD-10-CM | POA: Diagnosis not present

## 2017-05-07 NOTE — Therapy (Signed)
[facility] [address] [place], [place], [address] Phone: [phone]   Fax:  [phone]  Speech Language Pathology Treatment  Patient Details  Name: Kathy RobinsBelinda L Kennedy MRN: [id number] Date of Birth: 1955-10-13 Referring Provider: Dr. Jenne CampusMcQueen   Encounter Date: 05/07/2017  End of Session - 05/07/17 1512    Visit Number  2    Number of Visits  17    Date for SLP Re-Evaluation  06/29/17    SLP Start Time  1400    SLP Stop Time   1450    SLP Time Calculation (min)  50 min    Activity Tolerance  Patient tolerated treatment well       Past Medical History:  Diagnosis Date  . Stroke [facility](HCC)    3 years ago (mini stroke)    History reviewed. No pertinent surgical history.  There were no vitals filed for this visit.  Subjective Assessment - 05/07/17 1511    Subjective  Patient reports that vocal clarity is better with decreased laryngeal strain            ADULT SLP TREATMENT - 05/07/17 0001      General Information   Behavior/Cognition  Alert;Cooperative;Pleasant mood    HPI   [age] year old woman, with right vocal cord paralysis and successful voice therapy in 2016, referred by Dr. Jenne CampusMcQueen for voice therapy.  Per report, abnormal laryngeal findings include: right vocal cord paralysis unchanged from years ago fairly well medialized, left cord not quite coming over to meet the right.  Patient had speech therapy   in 2016 and was able to compensate well for the paralyzed VC.      Treatment Provided   Treatment provided  Cognitive-Linquistic      Pain Assessment   Pain Assessment  No/denies pain      Cognitive-Linquistic Treatment   Treatment focused on  Voice    Skilled Treatment  The patient was provided with written and verbal teaching regarding tongue and throat stretches exercises to promote relaxed phonation. The patient was provided with written and verbal teaching regarding breath support exercises.  The patient  was provided with verbal, written and recorded instruction in resonant voice exercises.  Patient able to maintain clear vocal quality with vocal loudness and reports less laryngeal strain with loudness.  Her habitual pitch remains high (2.5 STD above average for age and gender).  Using Visi Pitch fundamental frequency games the patient was able to change pitch.  At the end of the session, her habitual pitch tested at 291 Hz (down 1 STD        Assessment / Recommendations / Plan   Plan  Continue with current plan of care      Progression Toward Goals   Progression toward goals  Progressing toward goals       SLP Education - 05/07/17 1512    Education provided  Yes    Education Details  pitch glides    Person(s) Educated  Patient    Methods  Explanation    Comprehension  Verbalized understanding;Need further instruction         SLP Long Term Goals - 04/29/17 1530      SLP LONG TERM GOAL #1   Title  The patient will demonstrate independent understanding of vocal hygiene concepts and extrinsic laryngeal muscle stretches.      Time  8    Period  Weeks    Status  New  Target Date  06/29/17      SLP LONG TERM GOAL #3   Title  The patient will minimize vocal tension via resonant voice therapy (or comparable technique) with min SLP cues with 80% accuracy.    Time  8    Period  Weeks    Status  New    Target Date  06/29/17      SLP LONG TERM GOAL #4   Title  The patient will maximize voice quality and loudness using breath support for paragraph length recitation with 80% accuracy.    Time  8    Period  Weeks    Status  New    Target Date  06/29/17         Patient will benefit from skilled therapeutic intervention in order to improve the following deficits and impairments:   Dysphonia    Problem List Patient Active Problem List   Diagnosis Date Noted  . Depression 07/16/2016  . Plantar fasciitis 07/16/2016  . Fibromyalgia 07/16/2016  . Recurrent oral herpes simplex  07/16/2016  . Anxiety 05/08/2016  . Hx of hyperlipidemia 05/08/2016  . Allergic rhinitis 03/12/2016  . Chronic right shoulder pain 08/30/2015  . H/O transient cerebral ischemia 05/26/2014  . Hoarse 05/26/2014  . 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 Tendinitis of wrist 05/26/2014   Dollene Primrose, MS/CCC- SLP  Leandrew Koyanagi 05/07/2017, 3:13 PM  [place] [facility] MAIN [facility] SERVICES [address] [place], [place], [address] Phone: [phone]   Fax:  [phone]   Name: Kathy Kennedy MRN: [id number] Date of Birth: 1955-09-11

## 2017-05-09 ENCOUNTER — Ambulatory Visit: Payer: Federal, State, Local not specified - PPO | Admitting: Speech Pathology

## 2017-05-09 ENCOUNTER — Encounter: Payer: Self-pay | Admitting: Speech Pathology

## 2017-05-09 DIAGNOSIS — R490 Dysphonia: Secondary | ICD-10-CM

## 2017-05-09 NOTE — Therapy (Signed)
[place] [facility] [address] [place], [place], [address] Phone: [phone]   Fax:  [phone]  Speech Language Pathology Treatment  Patient Details  Name: Kathy Kennedy MRN: [id number] Date of Birth: 1955-04-15 Referring Provider: Dr. Jenne CampusMcQueen   Encounter Date: 05/09/2017  End of Session - 05/09/17 1606    Visit Number  3    Number of Visits  17    Date for SLP Re-Evaluation  06/29/17    SLP Start Time  1515    SLP Stop Time   1606    SLP Time Calculation (min)  51 min    Activity Tolerance  Patient tolerated treatment well       Past Medical History:  Diagnosis Date  . Stroke [facility](HCC)    3 years ago (mini stroke)    History reviewed. No pertinent surgical history.  There were no vitals filed for this visit.  Subjective Assessment - 05/09/17 1605    Subjective  Patient reports that vocal clarity is better with decreased laryngeal strain            ADULT SLP TREATMENT - 05/09/17 0001      General Information   Behavior/Cognition  Alert;Cooperative;Pleasant mood    HPI   62 year old woman, with right vocal cord paralysis and successful voice therapy in 2016, referred by Dr. Jenne CampusMcQueen for voice therapy.  Per report, abnormal laryngeal findings include: right vocal cord paralysis unchanged from years ago fairly well medialized, left cord not quite coming over to meet the right.  Patient had speech therapy   in 2016 and was able to compensate well for the paralyzed VC.      Treatment Provided   Treatment provided  Cognitive-Linquistic      Pain Assessment   Pain Assessment  No/denies pain      Cognitive-Linquistic Treatment   Treatment focused on  Voice    Skilled Treatment  The patient was provided with written and verbal teaching regarding tongue and throat stretches exercises to promote relaxed phonation. The patient was provided with written and verbal teaching regarding breath support exercises.  The patient  was provided with verbal, written and recorded instruction in resonant voice exercises.  Patient able to maintain clear vocal quality with vocal loudness and reports less laryngeal strain with loudness.  Her habitual pitch remains high (2.5 STD above average for age and gender).  Using Visi Pitch fundamental frequency games and fundamental frequency vs reference, the patient was able to change pitch.  At the end of the session, her habitual pitch tested at 250Hz  (which is within 1 STD).        Assessment / Recommendations / Plan   Plan  Continue with current plan of care      Progression Toward Goals   Progression toward goals  Progressing toward goals       SLP Education - 05/09/17 1605    Education provided  Yes    Education Details  pitch change    Person(s) Educated  Patient    Methods  Explanation    Comprehension  Verbalized understanding;Need further instruction         SLP Long Term Goals - 04/29/17 1530      SLP LONG TERM GOAL #1   Title  The patient will demonstrate independent understanding of vocal hygiene concepts and extrinsic laryngeal muscle stretches.      Time  8    Period  Weeks  Status  New    Target Date  06/29/17      SLP LONG TERM GOAL #3   Title  The patient will minimize vocal tension via resonant voice therapy (or comparable technique) with min SLP cues with 80% accuracy.    Time  8    Period  Weeks    Status  New    Target Date  06/29/17      SLP LONG TERM GOAL #4   Title  The patient will maximize voice quality and loudness using breath support for paragraph length recitation with 80% accuracy.    Time  8    Period  Weeks    Status  New    Target Date  06/29/17       Plan - 05/09/17 1606    Clinical Impression Statement  The patient is able to use vocal loudness to compensate for the paralyzed vocal cord and improve vocal quality.  Unfortunately she has habituated an elevated pitch (2.5 STD above average for gender).  With use of visual  feedback using the Visi Pitch, she was able to bring her pitch to within 1 STD.    Speech Therapy Frequency  2x / week    Duration  Other (comment)    Treatment/Interventions  Patient/family education;SLP instruction and feedback;Functional tasks Voice therapy    Potential to Achieve Goals  Good    SLP Home Exercise Plan  extrinsic laryngeal muscle stretches and resonant voice exercises; pitch glides and changes    Consulted and Agree with Plan of Care  Patient       Patient will benefit from skilled therapeutic intervention in order to improve the following deficits and impairments:   Dysphonia    Problem List Patient Active Problem List   Diagnosis Date Noted  . Depression 07/16/2016  . Plantar fasciitis 07/16/2016  . Fibromyalgia 07/16/2016  . 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  . Anxiety 05/08/2016  . Hx of hyperlipidemia 05/08/2016  . Allergic rhinitis 03/12/2016  . Chronic right shoulder pain 08/30/2015  . H/O transient cerebral ischemia 05/26/2014  . Hoarse 05/26/2014  . 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 Tendinitis of wrist 05/26/2014   Kathy Primrose, MS/CCC- SLP  Leandrew Koyanagi 05/09/2017, 4:10 PM  [place] [facility] MAIN [facility] SERVICES [address] [name], [place], [address] Phone: [phone]   Fax:  [phone]   Name: Kathy Kennedy MRN: [id number] Date of Birth: 1955-02-11

## 2017-05-15 ENCOUNTER — Ambulatory Visit: Payer: Federal, State, Local not specified - PPO | Admitting: Speech Pathology

## 2017-05-15 DIAGNOSIS — R490 Dysphonia: Secondary | ICD-10-CM

## 2017-05-16 ENCOUNTER — Encounter: Payer: Self-pay | Admitting: Speech Pathology

## 2017-05-16 NOTE — Therapy (Signed)
[place] [facility] [address] [place], [place], [address] Phone: [phone]   Fax:  [phone]  Speech Language Pathology Treatment  Patient Details  Name: Kathy Kennedy MRN: [id number] Date of Birth: Nov 16, 1955 Referring Provider: Dr. Jenne CampusMcQueen   Encounter Date: 05/15/2017  End of Session - 05/16/17 1242    Visit Number  4    Number of Visits  17    Date for SLP Re-Evaluation  06/29/17    SLP Start Time  1600    SLP Stop Time   1650    SLP Time Calculation (min)  50 min    Activity Tolerance  Patient tolerated treatment well       Past Medical History:  Diagnosis Date  . Stroke [facility](HCC)    3 years ago (mini stroke)    History reviewed. No pertinent surgical history.  There were no vitals filed for this visit.  Subjective Assessment - 05/16/17 1241    Subjective  Patient reports that vocal clarity is better with decreased laryngeal strain, but her pitch is still too high            ADULT SLP TREATMENT - 05/16/17 0001      General Information   Behavior/Cognition  Alert;Cooperative;Pleasant mood    HPI   62 year old woman, with right vocal cord paralysis and successful voice therapy in 2016, referred by Dr. Jenne CampusMcQueen for voice therapy.  Per report, abnormal laryngeal findings include: right vocal cord paralysis unchanged from years ago fairly well medialized, left cord not quite coming over to meet the right.  Patient had speech therapy   in 2016 and was able to compensate well for the paralyzed VC.      Treatment Provided   Treatment provided  Cognitive-Linquistic      Pain Assessment   Pain Assessment  No/denies pain      Cognitive-Linquistic Treatment   Treatment focused on  Voice    Skilled Treatment  The patient was provided with written and verbal teaching regarding tongue and throat stretches exercises to promote relaxed phonation. The patient was provided with written and verbal teaching regarding  breath support exercises.  The patient was provided with verbal, written and recorded instruction in resonant voice exercises.  Patient able to maintain clear vocal quality with vocal loudness and reports less laryngeal strain with loudness.  Initial habitual pitch remains high (280 Hz, 1.3 STD above average for age and gender).  Using Visi Pitch fundamental frequency games, fundamental frequency vs reference, monitoring pitch with Pitch Tuner app, and generate sustained vowel at the bottom of a swallow; the patient was able to change pitch.  At the end of the session, her habitual pitch tested at 187 Hz (over compensating- 2 STD below average).        Assessment / Recommendations / Plan   Plan  Continue with current plan of care      Progression Toward Goals   Progression toward goals  Progressing toward goals       SLP Education - 05/16/17 1242    Education provided  Yes    Education Details  try Pitch Tuner app    Person(s) Educated  Patient    Methods  Explanation    Comprehension  Verbalized understanding         SLP Long Term Goals - 04/29/17 1530      SLP LONG TERM GOAL #1   Title  The patient will demonstrate independent understanding  of vocal hygiene concepts and extrinsic laryngeal muscle stretches.      Time  8    Period  Weeks    Status  New    Target Date  06/29/17      SLP LONG TERM GOAL #3   Title  The patient will minimize vocal tension via resonant voice therapy (or comparable technique) with min SLP cues with 80% accuracy.    Time  8    Period  Weeks    Status  New    Target Date  06/29/17      SLP LONG TERM GOAL #4   Title  The patient will maximize voice quality and loudness using breath support for paragraph length recitation with 80% accuracy.    Time  8    Period  Weeks    Status  New    Target Date  06/29/17       Plan - 05/16/17 1242    Clinical Impression Statement  The patient is able to use vocal loudness to compensate for the paralyzed vocal  cord and improve vocal quality.  Unfortunately she has habituated an elevated pitch (2.5 STD above average for gender).  With use of visual feedback using the Visi Pitch, she was able to bring her pitch to within 1 STD.    Speech Therapy Frequency  2x / week    Duration  Other (comment)    Treatment/Interventions  Patient/family education;SLP instruction and feedback;Functional tasks    Potential to Achieve Goals  Good    Potential Considerations  Ability to learn/carryover information;Cooperation/participation level;Previous level of function;Other (comment)    SLP Home Exercise Plan  extrinsic laryngeal muscle stretches and resonant voice exercises; pitch glides and changes    Consulted and Agree with Plan of Care  Patient       Patient will benefit from skilled therapeutic intervention in order to improve the following deficits and impairments:   Dysphonia    Problem List Patient Active Problem List   Diagnosis Date Noted  . Depression 07/16/2016  . Plantar fasciitis 07/16/2016  . Fibromyalgia 07/16/2016  . 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  . Anxiety 05/08/2016  . Hx of hyperlipidemia 05/08/2016  . Allergic rhinitis 03/12/2016  . Chronic right shoulder pain 08/30/2015  . H/O transient cerebral ischemia 05/26/2014  . Hoarse 05/26/2014  . 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 Tendinitis of wrist 05/26/2014   Dollene Primrose, MS/CCC- SLP  Leandrew Koyanagi 05/16/2017, 12:43 PM  [facility] MAIN [facility] SERVICES [address] [place], [place], [address] Phone: [phone]   Fax:  [phone]   Name: Kathy Kennedy MRN: [id number] Date of Birth: 04-22-1955

## 2017-05-20 ENCOUNTER — Ambulatory Visit: Payer: Federal, State, Local not specified - PPO | Admitting: Speech Pathology

## 2017-05-20 ENCOUNTER — Encounter: Payer: Self-pay | Admitting: Speech Pathology

## 2017-05-20 DIAGNOSIS — R490 Dysphonia: Secondary | ICD-10-CM | POA: Diagnosis not present

## 2017-05-20 NOTE — Therapy (Signed)
[facility] MAIN [facility] SERVICES [address] [place], [place], [address] Phone: [phone]   Fax:  [phone]  Speech Language Pathology Treatment  Patient Details  Name: Kathy Kennedy MRN: [id number] Date of Birth: Mar 22, 1955 Referring Provider: Dr. Jenne Campus   Encounter Date: 05/20/2017  End of Session - 05/20/17 1408    Visit Number  5    Number of Visits  17    Date for SLP Re-Evaluation  06/29/17    SLP Start Time  1300    SLP Stop Time   1352    SLP Time Calculation (min)  52 min    Activity Tolerance  Patient tolerated treatment well       Past Medical History:  Diagnosis Date  . Stroke [facility])    3 years ago (mini stroke)    History reviewed. No pertinent surgical history.  There were no vitals filed for this visit.  Subjective Assessment - 05/20/17 1408    Subjective  Patient reports that co-workers say she sounds better            ADULT SLP TREATMENT - 05/20/17 0001      General Information   Behavior/Cognition  Alert;Cooperative;Pleasant mood    HPI   62 year old woman, with right vocal cord paralysis and successful voice therapy in 2016, referred by Dr. Jenne Campus for voice therapy.  Per report, abnormal laryngeal findings include: right vocal cord paralysis unchanged from years ago fairly well medialized, left cord not quite coming over to meet the right.  Patient had speech therapy   in 2016 and was able to compensate well for the paralyzed VC.      Treatment Provided   Treatment provided  Cognitive-Linquistic      Pain Assessment   Pain Assessment  No/denies pain      Cognitive-Linquistic Treatment   Treatment focused on  Voice    Skilled Treatment  The patient was provided with written and verbal teaching regarding tongue and throat stretches exercises to promote relaxed phonation. The patient was provided with written and verbal teaching regarding breath support exercises.  The patient was provided with  verbal, written and recorded instruction in resonant voice exercises.  Patient able to maintain clear vocal quality with vocal loudness and reports less laryngeal strain with loudness.  Initial habitual pitch was within 1 STD  average for age and gender).  Using Visi Pitch fundamental frequency games, fundamental frequency vs reference, and monitoring pitch with Pitch Tuner app; the patient was able to maintain pitch, with min sacrifice of vocal quality.  At the end of the session, MDVP showed good vocal quality at 271 Hz (within 1 STD).      Assessment / Recommendations / Plan   Plan  Continue with current plan of care      Progression Toward Goals   Progression toward goals  Progressing toward goals       SLP Education - 05/20/17 1408    Education provided  Yes    Education Details  combine vocal loudness with pitch control    Person(s) Educated  Patient    Methods  Explanation    Comprehension  Verbalized understanding         SLP Long Term Goals - 04/29/17 1530      SLP LONG TERM GOAL #1   Title  The patient will demonstrate independent understanding of vocal hygiene concepts and extrinsic laryngeal muscle stretches.      Time  8  Period  Weeks    Status  New    Target Date  06/29/17      SLP LONG TERM GOAL #3   Title  The patient will minimize vocal tension via resonant voice therapy (or comparable technique) with min SLP cues with 80% accuracy.    Time  8    Period  Weeks    Status  New    Target Date  06/29/17      SLP LONG TERM GOAL #4   Title  The patient will maximize voice quality and loudness using breath support for paragraph length recitation with 80% accuracy.    Time  8    Period  Weeks    Status  New    Target Date  06/29/17       Plan - 05/20/17 1409    Clinical Impression Statement  The patient is able to use vocal loudness to compensate for the paralyzed vocal cord and improve vocal quality.  Today she has been successful with maintaining lowered  pitch and she is able to read/speak using a pitch within 1 STD.    Speech Therapy Frequency  2x / week    Duration  Other (comment)    Treatment/Interventions  Patient/family education;SLP instruction and feedback;Functional tasks    Potential to Achieve Goals  Good    Potential Considerations  Ability to learn/carryover information;Cooperation/participation level;Previous level of function;Other (comment)    SLP Home Exercise Plan  extrinsic laryngeal muscle stretches and resonant voice exercises; vocal loudness; pitch glides and changes    Consulted and Agree with Plan of Care  Patient       Patient will benefit from skilled therapeutic intervention in order to improve the following deficits and impairments:   Dysphonia    Problem List Patient Active Problem List   Diagnosis Date Noted  . Depression 07/16/2016  . Plantar fasciitis 07/16/2016  . Fibromyalgia 07/16/2016  . 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  . Anxiety 05/08/2016  . Hx of hyperlipidemia 05/08/2016  . Allergic rhinitis 03/12/2016  . Chronic right shoulder pain 08/30/2015  . H/O transient cerebral ischemia 05/26/2014  . Hoarse 05/26/2014  . 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 Tendinitis of wrist 05/26/2014   Dollene PrimroseSusan G Katerin Negrete, MS/CCC- SLP  Leandrew KoyanagiAbernathy, Susie 05/20/2017, 2:10 PM  [facility] [address] [place], [place], [address] Phone: [phone]   Fax:  [phone]   Name: Kathy Kennedy MRN: [id number] Date of Birth: Oct 14, 1955

## 2017-05-23 ENCOUNTER — Ambulatory Visit: Payer: Federal, State, Local not specified - PPO | Admitting: Speech Pathology

## 2017-05-30 ENCOUNTER — Ambulatory Visit: Payer: Federal, State, Local not specified - PPO | Admitting: Speech Pathology

## 2017-05-30 ENCOUNTER — Encounter: Payer: Self-pay | Admitting: Speech Pathology

## 2017-05-30 DIAGNOSIS — R490 Dysphonia: Secondary | ICD-10-CM

## 2017-05-30 NOTE — Therapy (Signed)
[facility] MAIN [facility] SERVICES [address] [place], [place], [address] Phone: [phone]   Fax:  [phone]  Speech Language Pathology Treatment  Patient Details  Name: Kathy Kennedy MRN: [id number] Date of Birth: [birth year] Referring Provider: Dr. Jenne Campus   Encounter Date: 05/30/2017  End of Session - 05/30/17 1453    Visit Number  6    Number of Visits  17    Date for SLP Re-Evaluation  06/29/17    SLP Start Time  1400    SLP Stop Time   1450    SLP Time Calculation (min)  50 min    Activity Tolerance  Patient tolerated treatment well       Past Medical History:  Diagnosis Date  . Stroke [facility])    3 years ago (mini stroke)    History reviewed. No pertinent surgical history.  There were no vitals filed for this visit.  Subjective Assessment - 05/30/17 1452    Subjective  Pt pleasant and cooperative with unfamiliar therapist    Currently in Pain?  No/denies            ADULT SLP TREATMENT - 05/30/17 0001      General Information   Behavior/Cognition  Alert;Cooperative;Pleasant mood    HPI   62 year old woman, with right vocal cord paralysis and successful voice therapy in 2016, referred by Dr. Jenne Campus for voice therapy.  Per report, abnormal laryngeal findings include: right vocal cord paralysis unchanged from years ago fairly well medialized, left cord not quite coming over to meet the right.  Patient had speech therapy   in 2016 and was able to compensate well for the paralyzed VC.      Treatment Provided   Treatment provided  Cognitive-Linquistic      Pain Assessment   Pain Assessment  No/denies pain      Cognitive-Linquistic Treatment   Treatment focused on  Voice    Skilled Treatment  The patient was provided with written and verbal teaching regarding tongue and throat stretching exercises to promote relaxed phonation, breath support exercises, and resonant voice exercises.  She reports completion of HEP per  primary SLP. Today's session focused on targeting phonation of 3 scale notes, moving down in step-wise fashion. Pt indicated goal of ~250Hz , and was provided with scale noted and Hz equivalent. SLP and pt worked on practicing three note scales using piano keyboard app. Pt required cues to match piano note, and move slowly to the next note only when she had matched the current note. This written information was provided to pt, and a copy left in SLP chart. Pt was encouraged to practice at home, taping her practice to hear improvement in matching tones, and improvement in quality of voice production.     Assessment / Recommendations / Plan   Plan  Continue with current plan of care      Progression Toward Goals   Progression toward goals  Progressing toward goals       SLP Education - 05/30/17 1453    Education provided  Yes    Education Details  continue HEP, add scales, moving slowly 3 notes at a time    Person(s) Educated  Patient    Methods  Explanation;Demonstration;Verbal cues;Handout    Comprehension  Verbalized understanding;Returned demonstration;Need further instruction         SLP Long Term Goals - 05/30/17 1455      SLP LONG TERM GOAL #1   Title  The patient will demonstrate independent understanding of vocal hygiene concepts and extrinsic laryngeal muscle stretches.      Time  8    Period  Weeks    Status  On-going    Target Date  06/29/17      SLP LONG TERM GOAL #2   n/a          SLP LONG TERM GOAL #3   Title  The patient will minimize vocal tension via resonant voice therapy (or comparable technique) with min SLP cues with 80% accuracy.    Time  8    Period  Weeks    Status  On-going    Target Date  05/30/17      SLP LONG TERM GOAL #4   Title  The patient will maximize voice quality and loudness using breath support for paragraph length recitation with 80% accuracy.    Time  8    Period  Weeks    Status  On-going    Target Date  06/29/17       Plan -  05/30/17 1453    Clinical Impression Statement  Pt reports continued practice of HEP, and indicates she can produce upper and lower registers, but has difficulty with middle registers. Pt would benefit from continued skilled ST intervention focusing on plan of care for improved vocal quality and functional pitch.    Speech Therapy Frequency  2x / week    Duration  Other (comment)    Treatment/Interventions  Patient/family education;SLP instruction and feedback;Functional tasks    Potential to Achieve Goals  Good    Potential Considerations  Ability to learn/carryover information;Cooperation/participation level;Previous level of function;Other (comment)    SLP Home Exercise Plan  extrinsic laryngeal muscle stretches and resonant voice exercises; vocal loudness; pitch glides and changes    Consulted and Agree with Plan of Care  Patient       Patient will benefit from skilled therapeutic intervention in order to improve the following deficits and impairments:   Dysphonia    Problem List Patient Active Problem List   Diagnosis Date Noted  . Depression 07/16/2016  . Plantar fasciitis 07/16/2016  . Fibromyalgia 07/16/2016  . 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  . Anxiety 05/08/2016  . Hx of hyperlipidemia 05/08/2016  . Allergic rhinitis 03/12/2016  . Chronic right shoulder pain 08/30/2015  . H/O transient cerebral ischemia 05/26/2014  . Hoarse 05/26/2014  . 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 Tendinitis of wrist 05/26/2014   Camdin Hegner B. Murvin NatalBueche, [facility] Pu[facility] SoundMSP, CCC-SLP Speech Language Pathologist  Leigh AuroraBueche, Thorvald Orsino Brown 05/30/2017, 2:57 PM  [facility] [address] [place], [place], [address] Phone: [phone]   Fax:  [phone]   Name: Cherre RobinsBelinda L Buchberger MRN: [id number] Date of Birth: [birth year]

## 2017-06-04 DIAGNOSIS — J3801 Paralysis of vocal cords and larynx, unilateral: Secondary | ICD-10-CM | POA: Diagnosis not present

## 2017-06-04 DIAGNOSIS — R490 Dysphonia: Secondary | ICD-10-CM | POA: Diagnosis not present

## 2017-09-02 ENCOUNTER — Telehealth: Payer: Self-pay

## 2017-09-02 NOTE — Telephone Encounter (Signed)
On schedule for chest tightness and SOB tomorrow. Per Yetta BarreJones I called to triage: Chest tightness with SOB and leg pains better today than it was last few days. Increased stress over last few weeks. She feels leg pains are arthritis. She reports having SOB then and now today so I advised we move appt up to today. Patient declined and said she feels okay and needs to work. I went over the risks and advised her to go to ED if symptoms worsen before her scheduled appt tomorrow. Patient understands this is risky and will continue to remain at work with SOB and Leg pain with previous chest tightness.

## 2017-09-03 ENCOUNTER — Emergency Department
Admission: EM | Admit: 2017-09-03 | Discharge: 2017-09-03 | Disposition: A | Discharge status: Home Health | Payer: Federal, State, Local not specified - PPO | Attending: Emergency Medicine | Admitting: Emergency Medicine

## 2017-09-03 ENCOUNTER — Ambulatory Visit: Payer: Federal, State, Local not specified - PPO | Admitting: Family Medicine

## 2017-09-03 ENCOUNTER — Other Ambulatory Visit: Payer: Self-pay

## 2017-09-03 ENCOUNTER — Emergency Department: Payer: Federal, State, Local not specified - PPO

## 2017-09-03 ENCOUNTER — Encounter: Payer: Self-pay | Admitting: Family Medicine

## 2017-09-03 VITALS — BP 106/78 | HR 75 | Ht 64.0 in | Wt 133.0 lb

## 2017-09-03 DIAGNOSIS — Z87891 Personal history of nicotine dependence: Secondary | ICD-10-CM | POA: Insufficient documentation

## 2017-09-03 DIAGNOSIS — R079 Chest pain, unspecified: Secondary | ICD-10-CM | POA: Insufficient documentation

## 2017-09-03 DIAGNOSIS — R0789 Other chest pain: Secondary | ICD-10-CM

## 2017-09-03 DIAGNOSIS — M79651 Pain in right thigh: Secondary | ICD-10-CM

## 2017-09-03 DIAGNOSIS — R0602 Shortness of breath: Secondary | ICD-10-CM

## 2017-09-03 DIAGNOSIS — Z8673 Personal history of transient ischemic attack (TIA), and cerebral infarction without residual deficits: Secondary | ICD-10-CM | POA: Insufficient documentation

## 2017-09-03 LAB — TROPONIN I: Troponin I: <0.03 ng/mL (ref <0.03)

## 2017-09-03 LAB — BASIC METABOLIC PANEL
ANION GAP: 6 (ref 5–15)
BUN: 18 mg/dL (ref 8–23)
CO2: 27 mmol/L (ref 22–32)
Calcium: 8.9 mg/dL (ref 8.9–10.3)
Chloride: 106 mmol/L (ref 98–111)
Creatinine, Ser: 0.66 mg/dL (ref 0.44–1.00)
GFR calc Af Amer: >60 mL/min (ref >60)
Glucose, Bld: 104 mg/dL — ABNORMAL HIGH (ref 70–99)
POTASSIUM: 3.9 mmol/L (ref 3.5–5.1)
SODIUM: 139 mmol/L (ref 135–145)

## 2017-09-03 LAB — CBC
HEMATOCRIT: 40.8 % (ref 35.0–47.0)
HEMOGLOBIN: 14.1 g/dL (ref 12.0–16.0)
MCH: 31 pg (ref 26.0–34.0)
MCHC: 34.5 g/dL (ref 32.0–36.0)
MCV: 90 fL (ref 80.0–100.0)
Platelets: 218 10*3/uL (ref 150–440)
RBC: 4.54 MIL/uL (ref 3.80–5.20)
RDW: 13.9 % (ref 11.5–14.5)
WBC: 6.4 10*3/uL (ref 3.6–11.0)

## 2017-09-03 NOTE — ED Triage Notes (Signed)
Pt states x 1 week chest pressure in central chest. Denies SOB, N&V, dizzy, diaphoresis.   Alert, oriented, ambulatory.

## 2017-09-03 NOTE — ED Provider Notes (Signed)
[facility] Emergency Department Provider Note       Time seen: ----------------------------------------- 6:08 PM on 09/03/2017 -----------------------------------------   I have reviewed the triage vital signs and the nursing notes.  HISTORY   Chief Complaint Chest Pain    HPI Kathy Kennedy is a 62 y.o. female with a history of CVA, depression, fibromyalgia, hyperlipidemia, TIA who presents to the ED for 1 week of chest pressure in the central chest.  She denies any shortness of breath, nausea, vomiting, dizziness or diaphoresis.  Patient states the pain is intermittent, she denies any currently.  Typically when the pain starts she chews up and aspirin and the pain goes away.  She denies any previous cardiac history.  Past Medical History:  Diagnosis Date  . Stroke [facility])    3 years ago (mini stroke)    Patient Active Problem List   Diagnosis Date Noted  . Depression 07/16/2016  . Plantar fasciitis 07/16/2016  . Fibromyalgia 07/16/2016  . 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  . Anxiety 05/08/2016  . Hx of hyperlipidemia 05/08/2016  . Allergic rhinitis 03/12/2016  . Chronic right shoulder pain 08/30/2015  . H/O transient cerebral ischemia 05/26/2014  . Hoarse 05/26/2014  . 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 Tendinitis of wrist 05/26/2014    History reviewed. No pertinent surgical history.  Allergies Molds & smuts  Social History Social History   Tobacco Use  . Smoking status: Former Smoker    Packs/day: 1.00    Years: 10.00    Pack years: 10.00    Last attempt to quit: 02/04/1984    Years since quitting: 33.6  . Smokeless tobacco: Never Used  Substance Use Topics  . Alcohol use: No  . Drug use: No   Review of Systems Constitutional: Negative for fever. Cardiovascular: Positive for chest pain Respiratory: Negative for shortness of breath. Gastrointestinal: Negative for abdominal pain, vomiting and  diarrhea. Musculoskeletal: Negative for back pain. Skin: Negative for rash. Neurological: Negative for headaches, focal weakness or numbness.  All systems negative/normal/unremarkable except as stated in the HPI  ____________________________________________   PHYSICAL EXAM:  VITAL SIGNS: ED Triage Vitals  Enc Vitals Group     BP 09/03/17 1633 132/80     Pulse Rate 09/03/17 1633 64     Resp 09/03/17 1633 18     Temp 09/03/17 1633 98 F (36.7 C)     Temp Source 09/03/17 1633 Oral     SpO2 09/03/17 1633 99 %     Weight 09/03/17 1630 133 lb (60.3 kg)     Height 09/03/17 1630 5\' 4"  (1.626 m)     Head Circumference --      Peak Flow --      Pain Score 09/03/17 1630 5     Pain Loc --      Pain Edu? --      Excl. in GC? --    Constitutional: Alert and oriented. Well appearing and in no distress. Eyes: Conjunctivae are normal. Normal extraocular movements. Cardiovascular: Normal rate, regular rhythm. No murmurs, rubs, or gallops. Respiratory: Normal respiratory effort without tachypnea nor retractions. Breath sounds are clear and equal bilaterally. No wheezes/rales/rhonchi. Gastrointestinal: Soft and nontender. Normal bowel sounds Musculoskeletal: Nontender with normal range of motion in extremities. No lower extremity tenderness nor edema. Neurologic:  Normal speech and language. No gross focal neurologic deficits are appreciated.  Skin:  Skin is warm, dry and intact. No rash noted. Psychiatric: Mood and affect are normal.  Speech and behavior are normal.  ____________________________________________  EKG: Interpreted by me.  Sinus rhythm rate of 67 bpm, normal PR interval, normal QRS, normal QT  ____________________________________________  ED COURSE:  As part of my medical decision making, I reviewed the following data within the electronic MEDICAL RECORD NUMBER History obtained from family if available, nursing notes, old chart and ekg, as well as notes from prior ED  visits. Patient presented for chest pain, we will assess with labs and imaging as indicated at this time.   Procedures ____________________________________________   LABS (pertinent positives/negatives)  Labs Reviewed  BASIC METABOLIC PANEL - Abnormal; Notable for the following components:      Result Value   Glucose, Bld 104 (*)    All other components within normal limits  CBC  TROPONIN I    RADIOLOGY  Chest x-ray is unremarkable  ____________________________________________  DIFFERENTIAL DIAGNOSIS   Musculoskeletal pain, GERD, anxiety, unstable angina unlikely  FINAL ASSESSMENT AND PLAN  Chest pain   Plan: The patient had presented for nonspecific chest pain. Patient's labs were reassuring. Patient's imaging was also reassuring.  She is low risk for ACS, she will be referred to cardiology for close outpatient follow-up.   Ulice Dash, MD   Note: This note was generated in part or whole with voice recognition software. Voice recognition is usually quite accurate but there are transcription errors that can and very often do occur. I apologize for any typographical errors that were not detected and corrected.     Emily Filbert, MD 09/03/17 [id number]

## 2017-09-03 NOTE — Progress Notes (Signed)
Name: Kathy Kennedy   MRN: [id number]    DOB: 10-25-1955   Date:09/03/2017       Progress Note  Subjective  Chief Complaint  Chief Complaint  Patient presents with  . Pleurisy    Tightness in chest for 2-4 days then stopped and is bck today.   . Leg Pain    feels heavy at work but no swelling or numbness or weakness.     Leg Pain   The incident occurred more than 1 week ago (several weeks). There was no injury mechanism. The pain is present in the right thigh. The quality of the pain is described as aching. The pain is at a severity of 8/10. The pain is moderate. The pain has been fluctuating since onset. Pertinent negatives include no numbness or tingling. She has tried nothing (massage) for the symptoms. The treatment provided no relief.  Chest Pain   This is a new problem. The current episode started in the past 7 days ("about' a weekk ago). The onset quality is sudden. The problem occurs daily. The problem has been gradually improving. The pain is present in the substernal region. The pain is at a severity of 5/10. The quality of the pain is described as tightness. The pain does not radiate. Associated symptoms include claudication, leg pain, malaise/fatigue, palpitations and shortness of breath. Pertinent negatives include no abdominal pain, back pain, cough, diaphoresis, dizziness, exertional chest pressure, fever, headaches, hemoptysis, irregular heartbeat, lower extremity edema, nausea, near-syncope, numbness, orthopnea, PND, sputum production, syncope, vomiting or weakness. Risk factors include post-menopausal.  Pertinent negatives for past medical history include no CAD, no DVT and no PE.  Her family medical history is significant for CAD and heart disease.  Shortness of Breath  This is a recurrent problem. The current episode started in the past 7 days (occurs with dyspnea). The problem occurs intermittently. Associated symptoms include chest pain, claudication, leg pain and leg  swelling. Pertinent negatives include no abdominal pain, ear pain, fever, headaches, hemoptysis, neck pain, orthopnea, PND, rash, rhinorrhea, sore throat, sputum production, syncope, vomiting or wheezing. The symptoms are aggravated by emotional upset. Treatments tried: chew up 325mg  "works very well" The treatment provided moderate relief. There is no history of CAD, DVT, a heart failure or PE.    No problem-specific Assessment & Plan notes found for this encounter.   Past Medical History:  Diagnosis Date  . Stroke [facility])    3 years ago (mini stroke)    History reviewed. No pertinent surgical history.  Family History  Problem Relation Age of Onset  . Hyperlipidemia Mother   . Heart attack Father     Social History   Socioeconomic History  . Marital status: Married    Spouse name: Not on file  . Number of children: Not on file  . Years of education: Not on file  . Highest education level: Not on file  Occupational History  . Not on file  Social Needs  . Financial resource strain: Not on file  . Food insecurity:    Worry: Not on file    Inability: Not on file  . Transportation needs:    Medical: Not on file    Non-medical: Not on file  Tobacco Use  . Smoking status: Former Smoker    Packs/day: 1.00    Years: 10.00    Pack years: 10.00    Last attempt to quit: 02/04/1984    Years since quitting: 33.6  . Smokeless tobacco: Never Used  Substance and Sexual Activity  . Alcohol use: No  . Drug use: No  . Sexual activity: Not Currently    Birth control/protection: None  Lifestyle  . Physical activity:    Days per week: Not on file    Minutes per session: Not on file  . Stress: Not on file  Relationships  . Social connections:    Talks on phone: Not on file    Gets together: Not on file    Attends religious service: Not on file    Active member of club or organization: Not on file    Attends meetings of clubs or organizations: Not on file    Relationship status:  Not on file  . Intimate partner violence:    Fear of current or ex partner: Not on file    Emotionally abused: Not on file    Physically abused: Not on file    Forced sexual activity: Not on file  Other Topics Concern  . Not on file  Social History Narrative  . Not on file    Allergies  Allergen Reactions  . Molds & Smuts Other (See Comments)    sneezing    Outpatient Medications Prior to Visit  Medication Sig Dispense Refill  . aspirin 325 MG tablet Take 325 mg by mouth daily.    Marland Kitchen. aspirin 81 MG tablet Take 81 mg by mouth daily.     No facility-administered medications prior to visit.     Review of Systems  Constitutional: Positive for malaise/fatigue. Negative for chills, diaphoresis, fever and weight loss.  HENT: Negative for ear discharge, ear pain, rhinorrhea and sore throat.   Eyes: Negative for blurred vision.  Respiratory: Positive for shortness of breath. Negative for cough, hemoptysis, sputum production and wheezing.   Cardiovascular: Positive for chest pain, palpitations, claudication and leg swelling. Negative for orthopnea, syncope, PND and near-syncope.  Gastrointestinal: Negative for abdominal pain, blood in stool, constipation, diarrhea, heartburn, melena, nausea and vomiting.  Genitourinary: Negative for dysuria, frequency, hematuria and urgency.  Musculoskeletal: Negative for back pain, joint pain, myalgias and neck pain.  Skin: Negative for rash.  Neurological: Negative for dizziness, tingling, sensory change, focal weakness, weakness, numbness and headaches.  Endo/Heme/Allergies: Negative for environmental allergies and polydipsia. Does not bruise/bleed easily.  Psychiatric/Behavioral: Negative for depression and suicidal ideas. The patient is not nervous/anxious and does not have insomnia.      Objective  Vitals:   09/03/17 1510  BP: 106/78  Pulse: 75  SpO2: 98%  Weight: 133 lb (60.3 kg)  Height: 5\' 4"  (1.626 m)    Physical Exam   Constitutional: No distress.  HENT:  Head: Normocephalic and atraumatic.  Right Ear: External ear normal.  Left Ear: External ear normal.  Nose: Nose normal.  Mouth/Throat: Oropharynx is clear and moist.  Eyes: Pupils are equal, round, and reactive to light. Conjunctivae and EOM are normal. Right eye exhibits no discharge. Left eye exhibits no discharge.  Neck: Normal range of motion. Neck supple. No JVD present. No thyromegaly present.  Cardiovascular: Normal rate, regular rhythm, S1 normal, S2 normal, normal heart sounds, intact distal pulses and normal pulses. Exam reveals no gallop, no S3, no S4, no distant heart sounds and no friction rub.  No murmur heard.  No systolic murmur is present.  No diastolic murmur is present. Pulses:      Carotid pulses are 2+ on the right side, and 2+ on the left side.      Radial pulses are 2+  on the right side, and 2+ on the left side.       Femoral pulses are 2+ on the right side, and 2+ on the left side.      Popliteal pulses are 2+ on the right side, and 2+ on the left side.       Dorsalis pedis pulses are 2+ on the right side, and 2+ on the left side.       Posterior tibial pulses are 2+ on the right side, and 2+ on the left side.  Pulmonary/Chest: Effort normal and breath sounds normal. No stridor. She has no decreased breath sounds. She has no wheezes. She has no rhonchi. She has no rales.  Abdominal: Soft. Bowel sounds are normal. She exhibits no mass. There is no tenderness. There is no guarding.  Musculoskeletal: Normal range of motion. She exhibits no edema.  Lymphadenopathy:    She has no cervical adenopathy.  Neurological: She is alert. She has normal reflexes.  Skin: Skin is warm and dry. She is not diaphoretic.  Nursing note and vitals reviewed.     Assessment & Plan  Problem List Items Addressed This Visit    None    Visit Diagnoses    Chest tightness or pressure    -  Primary   Described as tightness self treating with  asa./suggest to go to er for evaluation   Relevant Orders   EKG 12-Lead (Completed)   Shortness of breath       Occurs only with chest tightness /relief staying still/to sleep/    Pain of right thigh       deep in muscle with no history of illness.      No orders of the defined types were placed in this encounter.     Dr. [facility]  09/03/17

## 2017-09-19 DIAGNOSIS — R079 Chest pain, unspecified: Secondary | ICD-10-CM | POA: Insufficient documentation

## 2017-09-19 DIAGNOSIS — E7800 Pure hypercholesterolemia, unspecified: Secondary | ICD-10-CM | POA: Diagnosis not present

## 2017-09-19 DIAGNOSIS — G459 Transient cerebral ischemic attack, unspecified: Secondary | ICD-10-CM | POA: Diagnosis not present

## 2017-09-19 DIAGNOSIS — R0602 Shortness of breath: Secondary | ICD-10-CM | POA: Insufficient documentation

## 2017-10-23 DIAGNOSIS — R079 Chest pain, unspecified: Secondary | ICD-10-CM | POA: Diagnosis not present

## 2017-10-23 DIAGNOSIS — R0602 Shortness of breath: Secondary | ICD-10-CM | POA: Diagnosis not present

## 2017-10-23 DIAGNOSIS — G459 Transient cerebral ischemic attack, unspecified: Secondary | ICD-10-CM | POA: Diagnosis not present

## 2017-10-28 DIAGNOSIS — G459 Transient cerebral ischemic attack, unspecified: Secondary | ICD-10-CM | POA: Diagnosis not present

## 2017-10-28 DIAGNOSIS — E7800 Pure hypercholesterolemia, unspecified: Secondary | ICD-10-CM | POA: Diagnosis not present

## 2017-10-28 DIAGNOSIS — R079 Chest pain, unspecified: Secondary | ICD-10-CM | POA: Diagnosis not present

## 2017-10-28 DIAGNOSIS — R0602 Shortness of breath: Secondary | ICD-10-CM | POA: Diagnosis not present

## 2017-11-27 ENCOUNTER — Encounter: Payer: Self-pay | Admitting: Family Medicine

## 2017-11-27 ENCOUNTER — Ambulatory Visit: Payer: Federal, State, Local not specified - PPO | Admitting: Family Medicine

## 2017-11-27 VITALS — BP 130/80 | HR 76 | Ht 64.0 in | Wt 137.0 lb

## 2017-11-27 DIAGNOSIS — R2231 Localized swelling, mass and lump, right upper limb: Secondary | ICD-10-CM

## 2017-11-27 DIAGNOSIS — Z23 Encounter for immunization: Secondary | ICD-10-CM

## 2017-11-27 NOTE — Progress Notes (Signed)
Date:  11/27/2017   Name:  Kathy Kennedy   DOB:  [birth year]   MRN:  [id number]   Chief Complaint: lumps in arm (has 2 "lumps" in R) arm- they do not cause pain) and Flu Vaccine Patient presents for evaluation of two nodular like areas of right wrist.    Review of Systems  Constitutional: Negative.  Negative for chills, fatigue, fever and unexpected weight change.  HENT: Negative for congestion, ear discharge, ear pain, rhinorrhea, sinus pressure, sneezing and sore throat.   Eyes: Negative for photophobia, pain, discharge, redness and itching.  Respiratory: Negative for cough, shortness of breath, wheezing and stridor.   Cardiovascular: Negative for chest pain, palpitations and leg swelling.  Gastrointestinal: Negative for abdominal pain, blood in stool, constipation, diarrhea, nausea and vomiting.  Endocrine: Negative for cold intolerance, heat intolerance, polydipsia, polyphagia and polyuria.  Genitourinary: Negative for dysuria, flank pain, frequency, hematuria, menstrual problem, pelvic pain, urgency, vaginal bleeding and vaginal discharge.  Musculoskeletal: Negative for arthralgias, back pain and myalgias.  Skin: Negative for rash.  Allergic/Immunologic: Negative for environmental allergies and food allergies.  Neurological: Negative for dizziness, weakness, light-headedness, numbness and headaches.  Hematological: Negative for adenopathy. Does not bruise/bleed easily.  Psychiatric/Behavioral: Negative for dysphoric mood. The patient is not nervous/anxious.     Patient Active Problem List   Diagnosis Date Noted  . Pure hypercholesterolemia 09/19/2017  . SOB (shortness of breath) on exertion 09/19/2017  . TIA (transient ischemic attack) 09/19/2017  . Chest pain with high risk for cardiac etiology 09/19/2017  . Depression 07/16/2016  . Plantar fasciitis 07/16/2016  . Fibromyalgia 07/16/2016  . 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  . Anxiety 05/08/2016  . Hx of  hyperlipidemia 05/08/2016  . Allergic rhinitis 03/12/2016  . Chronic right shoulder pain 08/30/2015  . H/O transient cerebral ischemia 05/26/2014  . Hoarse 05/26/2014  . 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 Tendinitis of wrist 05/26/2014    Allergies  Allergen Reactions  . Molds & Smuts Other (See Comments)    sneezing    No past surgical history on file.  Social History   Tobacco Use  . Smoking status: Former Smoker    Packs/day: 1.00    Years: 10.00    Pack years: 10.00    Last attempt to quit: 02/04/1984    Years since quitting: 33.8  . Smokeless tobacco: Never Used  Substance Use Topics  . Alcohol use: No  . Drug use: No     Medication list has been reviewed and updated.  Current Meds  Medication Sig  . aspirin 325 MG tablet Take 325 mg by mouth daily.  . vitamin E 1000 UNIT capsule Take 1 capsule by mouth daily.    PHQ 2/9 Scores 11/25/2016  PHQ - 2 Score 0    Physical Exam  Constitutional: No distress.  HENT:  Head: Normocephalic and atraumatic.  Right Ear: External ear normal.  Left Ear: External ear normal.  Nose: Nose normal.  Mouth/Throat: Oropharynx is clear and moist.  Eyes: Pupils are equal, round, and reactive to light. Conjunctivae and EOM are normal. Right eye exhibits no discharge. Left eye exhibits no discharge.  Neck: Normal range of motion. Neck supple. No JVD present. No thyromegaly present.  Cardiovascular: Normal rate, regular rhythm, normal heart sounds and intact distal pulses. Exam reveals no gallop and no friction rub.  No murmur heard. Pulmonary/Chest: Effort normal and breath sounds normal.  Abdominal: Soft. Bowel sounds are normal. She exhibits  no mass. There is no tenderness. There is no guarding.  Musculoskeletal: Normal range of motion. She exhibits no edema.  Lymphadenopathy:    She has no cervical adenopathy.  Neurological: She is alert. She has normal reflexes.  Skin: Skin is warm and dry. She is not  diaphoretic.     Two nodular-like areas of right medial wtist. Moves with skin/ ? Cyst vs venous valves  Nursing note and vitals reviewed.   BP 130/80   Pulse 76   Ht 5\' 4"  (1.626 m)   Wt 137 lb (62.1 kg)   LMP 02/03/2013   BMI 23.52 kg/m   Assessment and Plan:  1. Mass of right wrist C/w small cyst but more likely palpable venous valves. - Ambulatory referral to Dermatology  2. Flu vaccine need Discussed and administered - Flu Vaccine QUAD 6+ mos PF IM (Fluarix Quad PF)   Dr. Elizabeth Sauer [facility]  11/27/2017

## 2017-12-15 ENCOUNTER — Encounter: Payer: Self-pay | Admitting: Family Medicine

## 2017-12-15 ENCOUNTER — Other Ambulatory Visit (HOSPITAL_COMMUNITY)
Admission: RE | Admit: 2017-12-15 | Discharge: 2017-12-15 | Disposition: A | Discharge status: Home / Self Care | Payer: Federal, State, Local not specified - PPO | Source: Ambulatory Visit | Attending: Family Medicine | Admitting: Family Medicine

## 2017-12-15 ENCOUNTER — Ambulatory Visit (INDEPENDENT_AMBULATORY_CARE_PROVIDER_SITE_OTHER): Payer: Federal, State, Local not specified - PPO | Admitting: Family Medicine

## 2017-12-15 VITALS — BP 120/78 | HR 72 | Ht 64.0 in | Wt 137.0 lb

## 2017-12-15 DIAGNOSIS — Z1231 Encounter for screening mammogram for malignant neoplasm of breast: Secondary | ICD-10-CM | POA: Diagnosis not present

## 2017-12-15 DIAGNOSIS — Z01419 Encounter for gynecological examination (general) (routine) without abnormal findings: Secondary | ICD-10-CM | POA: Diagnosis not present

## 2017-12-15 DIAGNOSIS — Z1211 Encounter for screening for malignant neoplasm of colon: Secondary | ICD-10-CM | POA: Diagnosis not present

## 2017-12-15 DIAGNOSIS — Z0000 Encounter for general adult medical examination without abnormal findings: Secondary | ICD-10-CM

## 2017-12-15 DIAGNOSIS — E7800 Pure hypercholesterolemia, unspecified: Secondary | ICD-10-CM

## 2017-12-15 DIAGNOSIS — Z124 Encounter for screening for malignant neoplasm of cervix: Secondary | ICD-10-CM

## 2017-12-15 LAB — HEMOCCULT GUIAC POC 1CARD (OFFICE): FECAL OCCULT BLD: NEGATIVE

## 2017-12-15 NOTE — Progress Notes (Signed)
Date:  12/15/2017   Name:  ROSALEA Kennedy   DOB:  1955/05/09   MRN:  [id number]   Chief Complaint: Annual Exam (pap, mammo and colonoscopy) Patient is a 62 year old female who presents for a comprehensive physical exam. The patient reports the following problems: needs derm appt. Health maintenance has been reviewed pap/mammogram/ colonoscopy to be done.    Review of Systems  Constitutional: Negative.  Negative for chills, fatigue, fever and unexpected weight change.  HENT: Negative for congestion, ear discharge, ear pain, rhinorrhea, sinus pressure, sneezing and sore throat.   Eyes: Negative for photophobia, pain, discharge, redness and itching.  Respiratory: Negative for cough, shortness of breath, wheezing and stridor.   Gastrointestinal: Negative for abdominal pain, blood in stool, constipation, diarrhea, nausea and vomiting.  Endocrine: Negative for cold intolerance, heat intolerance, polydipsia, polyphagia and polyuria.  Genitourinary: Negative for dysuria, flank pain, frequency, hematuria, menstrual problem, pelvic pain, urgency, vaginal bleeding and vaginal discharge.  Musculoskeletal: Negative for arthralgias, back pain and myalgias.  Skin: Negative for rash.  Allergic/Immunologic: Negative for environmental allergies and food allergies.  Neurological: Negative for dizziness, weakness, light-headedness, numbness and headaches.  Hematological: Negative for adenopathy. Does not bruise/bleed easily.  Psychiatric/Behavioral: Negative for dysphoric mood. The patient is not nervous/anxious.     Patient Active Problem List   Diagnosis Date Noted  . Pure hypercholesterolemia 09/19/2017  . SOB (shortness of breath) on exertion 09/19/2017  . TIA (transient ischemic attack) 09/19/2017  . Chest pain with high risk for cardiac etiology 09/19/2017  . Depression 07/16/2016  . Plantar fasciitis 07/16/2016  . Fibromyalgia 07/16/2016  . 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  .  Anxiety 05/08/2016  . Hx of hyperlipidemia 05/08/2016  . Allergic rhinitis 03/12/2016  . Chronic right shoulder pain 08/30/2015  . H/O transient cerebral ischemia 05/26/2014  . Hoarse 05/26/2014  . 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 Tendinitis of wrist 05/26/2014    Allergies  Allergen Reactions  . Molds & Smuts Other (See Comments)    sneezing    No past surgical history on file.  Social History   Tobacco Use  . Smoking status: Former Smoker    Packs/day: 1.00    Years: 10.00    Pack years: 10.00    Last attempt to quit: 02/04/1984    Years since quitting: 33.8  . Smokeless tobacco: Never Used  Substance Use Topics  . Alcohol use: No  . Drug use: No     Medication list has been reviewed and updated.  Current Meds  Medication Sig  . b complex vitamins tablet Take 1 tablet by mouth daily.  Marland Kitchen VITAMIN A PO Take 1 tablet by mouth daily.  . vitamin C (ASCORBIC ACID) 250 MG tablet Take 250 mg by mouth daily.  . vitamin E 1000 UNIT capsule Take 1 capsule by mouth daily.    PHQ 2/9 Scores 12/15/2017 11/25/2016  PHQ - 2 Score 0 0  PHQ- 9 Score 1 -    Physical Exam  Constitutional: No distress.  HENT:  Head: Normocephalic and atraumatic.  Right Ear: Hearing, tympanic membrane, external ear and ear canal normal.  Left Ear: Hearing, tympanic membrane, external ear and ear canal normal.  Nose: Nose normal.  Mouth/Throat: Uvula is midline and oropharynx is clear and moist. No oropharyngeal exudate, posterior oropharyngeal edema or posterior oropharyngeal erythema.  Eyes: Pupils are equal, round, and reactive to light. Conjunctivae and EOM are normal. Right eye exhibits no discharge.  Left eye exhibits no discharge.  Neck: Trachea normal and normal range of motion. Neck supple. Normal carotid pulses, no hepatojugular reflux and no JVD present. Carotid bruit is not present. No thyroid mass and no thyromegaly present.  Cardiovascular: Normal rate, regular  rhythm, normal heart sounds and intact distal pulses. Exam reveals no gallop and no friction rub.  No murmur heard. Pulmonary/Chest: Effort normal and breath sounds normal. Right breast exhibits no inverted nipple, no mass, no nipple discharge, no skin change and no tenderness. Left breast exhibits no inverted nipple, no mass, no nipple discharge, no skin change and no tenderness. No breast swelling, tenderness, discharge or bleeding. Breasts are symmetrical.  Abdominal: Soft. Bowel sounds are normal. She exhibits no mass. There is no hepatosplenomegaly. There is no tenderness. There is no guarding. No hernia.  Genitourinary: Rectum normal and vagina normal. Rectal exam shows no external hemorrhoid, no internal hemorrhoid, no fissure, no mass and guaiac negative stool. Pelvic exam was performed with patient supine. There is no rash, tenderness or lesion on the right labia. There is no rash, tenderness or lesion on the left labia. Uterus is fixed. Uterus is not deviated, not enlarged and not tender. Cervix exhibits no motion tenderness, no discharge and no friability. Right adnexum displays no mass and no tenderness. Left adnexum displays no mass and no tenderness.  Genitourinary Comments: nulliparis  Musculoskeletal: Normal range of motion. She exhibits no edema.  Lymphadenopathy:       Head (right side): No submandibular adenopathy present.       Head (left side): No submandibular adenopathy present.    She has no cervical adenopathy.       Right cervical: No superficial cervical adenopathy present.      Left cervical: No superficial cervical adenopathy present.    She has no axillary adenopathy.  Neurological: She is alert. She has normal strength and normal reflexes. No cranial nerve deficit or sensory deficit.  Reflex Scores:      Tricep reflexes are 2+ on the right side and 2+ on the left side.      Bicep reflexes are 2+ on the right side and 2+ on the left side.      Brachioradialis reflexes  are 2+ on the right side and 2+ on the left side.      Patellar reflexes are 2+ on the right side and 2+ on the left side.      Achilles reflexes are 2+ on the right side and 2+ on the left side. Skin: Skin is warm and dry. She is not diaphoretic.    BP 120/78   Pulse 72   Ht 5\' 4"  (1.626 m)   Wt 137 lb (62.1 kg)   LMP 02/03/2013   BMI 23.52 kg/m   Assessment and Plan:  1. Visit for screening mammogram Breast exam and mammogram scheduled. - MM 3D SCREEN BREAST BILATERAL; Future  2. Annual physical exam No subjective/objective concerns noted. - Cytology - PAP  3. Periodic health assessment, Pap and pelvic Pap and pelvic.  4. Pure hypercholesterolemia Chronic. Diet Controlled. Check lipid panel. - Lipid panel  5. Cervical cancer screening Pap obtained. - Cytology - PAP  6. Colon cancer screening Discussed and colonoscopy scheduled. - POCT occult blood stool    Dr. Elizabeth Sauer [facility]  12/15/2017

## 2017-12-16 LAB — LIPID PANEL
Chol/HDL Ratio: 3.1 ratio (ref 0.0–4.4)
Cholesterol, Total: 226 mg/dL — ABNORMAL HIGH (ref 100–199)
HDL: 74 mg/dL (ref >39)
LDL Calculated: 141 mg/dL — ABNORMAL HIGH (ref 0–99)
Triglycerides: 54 mg/dL (ref 0–149)
VLDL Cholesterol Cal: 11 mg/dL (ref 5–40)

## 2017-12-17 LAB — CYTOLOGY - PAP: Diagnosis: NEGATIVE

## 2017-12-23 ENCOUNTER — Ambulatory Visit
Admission: RE | Admit: 2017-12-23 | Discharge: 2017-12-23 | Disposition: A | Discharge status: Home / Self Care | Payer: Federal, State, Local not specified - PPO | Source: Ambulatory Visit | Attending: Family Medicine | Admitting: Family Medicine

## 2017-12-23 DIAGNOSIS — Z1231 Encounter for screening mammogram for malignant neoplasm of breast: Secondary | ICD-10-CM | POA: Diagnosis not present

## 2017-12-23 DIAGNOSIS — R490 Dysphonia: Secondary | ICD-10-CM | POA: Diagnosis not present

## 2017-12-23 DIAGNOSIS — J3801 Paralysis of vocal cords and larynx, unilateral: Secondary | ICD-10-CM | POA: Diagnosis not present

## 2017-12-23 DIAGNOSIS — I8290 Acute embolism and thrombosis of unspecified vein: Secondary | ICD-10-CM | POA: Diagnosis not present

## 2018-06-17 ENCOUNTER — Other Ambulatory Visit: Payer: Self-pay

## 2018-06-17 ENCOUNTER — Ambulatory Visit (INDEPENDENT_AMBULATORY_CARE_PROVIDER_SITE_OTHER): Payer: Federal, State, Local not specified - PPO

## 2018-06-17 ENCOUNTER — Ambulatory Visit
Admission: EM | Admit: 2018-06-17 | Discharge: 2018-06-17 | Disposition: A | Discharge status: Home / Self Care | Payer: Federal, State, Local not specified - PPO | Attending: Family Medicine | Admitting: Family Medicine

## 2018-06-17 DIAGNOSIS — R0602 Shortness of breath: Secondary | ICD-10-CM

## 2018-06-17 DIAGNOSIS — M546 Pain in thoracic spine: Secondary | ICD-10-CM

## 2018-06-17 DIAGNOSIS — M47814 Spondylosis without myelopathy or radiculopathy, thoracic region: Secondary | ICD-10-CM | POA: Diagnosis not present

## 2018-06-17 LAB — CBC WITH DIFFERENTIAL/PLATELET
Abs Immature Granulocytes: 0.01 10*3/uL (ref 0.00–0.07)
Basophils Absolute: 0 10*3/uL (ref 0.0–0.1)
Basophils Relative: 1 %
Eosinophils Absolute: 0.3 10*3/uL (ref 0.0–0.5)
Eosinophils Relative: 4 %
HCT: 37.1 % (ref 36.0–46.0)
Hemoglobin: 12.6 g/dL (ref 12.0–15.0)
Immature Granulocytes: 0 %
Lymphocytes Relative: 41 %
Lymphs Abs: 2.4 10*3/uL (ref 0.7–4.0)
MCH: 30.7 pg (ref 26.0–34.0)
MCHC: 34 g/dL (ref 30.0–36.0)
MCV: 90.5 fL (ref 80.0–100.0)
Monocytes Absolute: 0.5 10*3/uL (ref 0.1–1.0)
Monocytes Relative: 9 %
Neutro Abs: 2.6 10*3/uL (ref 1.7–7.7)
Neutrophils Relative %: 45 %
Platelets: 215 10*3/uL (ref 150–400)
RBC: 4.1 MIL/uL (ref 3.87–5.11)
RDW: 13.1 % (ref 11.5–15.5)
WBC: 5.8 10*3/uL (ref 4.0–10.5)
nRBC: 0 % (ref 0.0–0.2)

## 2018-06-17 LAB — COMPREHENSIVE METABOLIC PANEL
ALT: 21 U/L (ref 0–44)
AST: 27 U/L (ref 15–41)
Albumin: 3.8 g/dL (ref 3.5–5.0)
Alkaline Phosphatase: 57 U/L (ref 38–126)
Anion gap: 5 (ref 5–15)
BUN: 13 mg/dL (ref 8–23)
CO2: 24 mmol/L (ref 22–32)
Calcium: 8.4 mg/dL — ABNORMAL LOW (ref 8.9–10.3)
Chloride: 104 mmol/L (ref 98–111)
Creatinine, Ser: 0.64 mg/dL (ref 0.44–1.00)
GFR calc Af Amer: >60 mL/min (ref >60)
GFR calc non Af Amer: >60 mL/min (ref >60)
Glucose, Bld: 108 mg/dL — ABNORMAL HIGH (ref 70–99)
Potassium: 3.6 mmol/L (ref 3.5–5.1)
Sodium: 133 mmol/L — ABNORMAL LOW (ref 135–145)
Total Bilirubin: 0.5 mg/dL (ref 0.3–1.2)
Total Protein: 6.7 g/dL (ref 6.5–8.1)

## 2018-06-17 LAB — LIPASE, BLOOD: Lipase: 28 U/L (ref 11–51)

## 2018-06-17 MED ORDER — TRAMADOL HCL 50 MG PO TABS: 50.0000 mg | ORAL_TABLET | Freq: Two times a day (BID) | ORAL | 0 refills | Status: DC | PRN

## 2018-06-17 NOTE — ED Provider Notes (Signed)
[facility]    CSN: [id number] Arrival date & time: 06/17/18  1505  History   Chief Complaint Chief Complaint  Patient presents with  . Shortness of Breath   HPI  63 year old female presents with shortness of breath.  Patient reports she has been short of breath for the past week.  She has had some thoracic back pain in the midline for the past few days.  Patient denies cough.  Denies fever.  Patient states that it is "hard to breathe".  Patient has difficulty elaborating on what she means.  Patient endorses pain in her back as well as pain in her upper abdomen particular with inspiration.  Patient reports a decrease in p.o. intake and appetite.  This has been a recent change.  No nausea or vomiting.  No known relieving factors.  Denies chest pain.  No other associated symptoms.  No other complaints.  History reviewed as below. PMH: Patient Active Problem List   Diagnosis Date Noted  . Pure hypercholesterolemia 09/19/2017  . SOB (shortness of breath) on exertion 09/19/2017  . TIA (transient ischemic attack) 09/19/2017  . Chest pain with high risk for cardiac etiology 09/19/2017  . Depression 07/16/2016  . Plantar fasciitis 07/16/2016  . Fibromyalgia 07/16/2016  . 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  . Anxiety 05/08/2016  . Hx of hyperlipidemia 05/08/2016  . Allergic rhinitis 03/12/2016  . Chronic right shoulder pain 08/30/2015  . H/O transient cerebral ischemia 05/26/2014  . Hoarse 05/26/2014  . 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 Tendinitis of wrist 05/26/2014    Past Surgical History:  Procedure Laterality Date  . NO PAST SURGERIES      OB History    Gravida  0   Para  0   Term  0   Preterm  0   AB  0   Living  0     SAB  0   TAB  0   Ectopic  0   Multiple  0   Live Births               Home Medications    Prior to Admission medications   Medication Sig Start Date End Date Taking? Authorizing Provider   aspirin 325 MG tablet Take 325 mg by mouth daily.   Yes [provider]  b complex vitamins tablet Take 1 tablet by mouth daily.   Yes [provider]  VITAMIN A PO Take 1 tablet by mouth daily.   Yes [provider]  vitamin C (ASCORBIC ACID) 250 MG tablet Take 250 mg by mouth daily.   Yes [provider]  vitamin E 1000 UNIT capsule Take 1 capsule by mouth daily.   Yes [provider]  traMADol (ULTRAM) 50 MG tablet Take 1 tablet (50 mg total) by mouth every 12 (twelve) hours as needed. 06/17/18   Tommie Sams, DO    Family History Family History  Problem Relation Age of Onset  . Hyperlipidemia Mother   . Heart attack Father     Social History Social History   Tobacco Use  . Smoking status: Former Smoker    Packs/day: 1.00    Years: 10.00    Pack years: 10.00    Last attempt to quit: 02/04/1984    Years since quitting: 34.3  . Smokeless tobacco: Never Used  Substance Use Topics  . Alcohol use: No  . Drug use: No     Allergies   Molds & smuts  Review of Systems Review of Systems  Constitutional: Positive for appetite change. Negative for fever.  Respiratory: Positive for shortness of breath. Negative for cough.   Gastrointestinal: Positive for abdominal pain.  Musculoskeletal: Positive for back pain.   Physical Exam Triage Vital Signs ED Triage Vitals  Enc Vitals Group     BP 06/17/18 1517 135/85     Pulse Rate 06/17/18 1517 61     Resp 06/17/18 1517 16     Temp 06/17/18 1517 98.2 F (36.8 C)     Temp Source 06/17/18 1517 Oral     SpO2 06/17/18 1517 100 %     Weight 06/17/18 1515 138 lb (62.6 kg)     Height 06/17/18 1515 5\' 4"  (1.626 m)     Head Circumference --      Peak Flow --      Pain Score 06/17/18 1512 3     Pain Loc --      Pain Edu? --      Excl. in GC? --    Updated Vital Signs BP 135/85 (BP Location: Left Arm)   Pulse 61   Temp 98.2 F (36.8 C) (Oral)   Resp 16   Ht 5\' 4"  (1.626 m)   Wt  62.6 kg   LMP 02/03/2013   SpO2 100%   BMI 23.69 kg/m   Visual Acuity Right Eye Distance:   Left Eye Distance:   Bilateral Distance:    Right Eye Near:   Left Eye Near:    Bilateral Near:     Physical Exam Vitals signs and nursing note reviewed.  Constitutional:      General: She is not in acute distress.    Appearance: She is well-developed and normal weight.  HENT:     Head: Normocephalic and atraumatic.  Eyes:     General: No scleral icterus.    Conjunctiva/sclera: Conjunctivae normal.  Cardiovascular:     Rate and Rhythm: Normal rate and regular rhythm.     Heart sounds: No murmur.  Pulmonary:     Effort: Pulmonary effort is normal.     Breath sounds: No wheezing, rhonchi or rales.  Abdominal:     General: There is no distension.     Palpations: Abdomen is soft.     Tenderness: There is no abdominal tenderness.  Neurological:     Mental Status: She is alert.  Psychiatric:        Mood and Affect: Mood normal.        Behavior: Behavior normal.    UC Treatments / Results  Labs (all labs ordered are listed, but only abnormal results are displayed) Labs Reviewed  COMPREHENSIVE METABOLIC PANEL - Abnormal; Notable for the following components:      Result Value   Sodium 133 (*)    Glucose, Bld 108 (*)    Calcium 8.4 (*)    All other components within normal limits  CBC WITH DIFFERENTIAL/PLATELET  LIPASE, BLOOD    EKG None  Radiology Dg Chest 2 View  Result Date: 06/17/2018 CLINICAL DATA:  Shortness of breath EXAM: CHEST - 2 VIEW COMPARISON:  Chest x-ray dated 09/03/2017. FINDINGS: The heart size and mediastinal contours are within normal limits. Both lungs are clear. The visualized skeletal structures are unremarkable. IMPRESSION: No active cardiopulmonary disease. Electronically Signed   By: Katherine Mantle M.D.   On: 06/17/2018 16:05   Dg Thoracic Spine 2 View  Result Date: 06/17/2018 CLINICAL DATA:  Back pain EXAM: THORACIC SPINE 2 VIEWS  COMPARISON:  Or CT chest dated 01/25/2014 FINDINGS: There is no evidence of thoracic spine fracture. Alignment is normal. No other significant bone abnormalities are identified. Minimal degenerative changes are noted. IMPRESSION: 1. No acute osseous abnormality. 2. Minimal degenerative changes are noted of the thoracic spine. Electronically Signed   By: Katherine Mantlehristopher  Green M.D.   On: 06/17/2018 16:04    Procedures Procedures (including critical care time)  Medications Ordered in UC Medications - No data to display  Initial Impression / Assessment and Plan / UC Course  I have reviewed the triage vital signs and the nursing notes.  Pertinent labs & imaging results that were available during my care of the patient were reviewed by me and considered in my medical decision making (see chart for details).    63 year old female presents with complaints of shortness of breath.  Patient also complains of upper abdominal fullness, decreased appetite, back pain.  Patient is well-appearing.  She is not dyspneic.  Exam unrevealing.  Chest x-ray as well as laboratory studies unremarkable.  Minimal degenerative changes of thoracic spine.  Tramadol as needed for pain.  Advised follow-up with primary care physician.  Final Clinical Impressions(s) / UC Diagnoses   Final diagnoses:  SOB (shortness of breath)  Acute bilateral thoracic back pain     Discharge Instructions     Labs unremarkable.  Xray with mild degenerative changes of the spine.  Chest xray clear.  Rest.  Medication as needed.  Follow up with PCP.  Take care  Dr. Adriana Simasook     ED Prescriptions    Medication Sig Dispense Auth. Provider   traMADol (ULTRAM) 50 MG tablet Take 1 tablet (50 mg total) by mouth every 12 (twelve) hours as needed. 10 tablet Tommie Samsook, Jaikob Borgwardt G, DO     Controlled Substance Prescriptions [place] Controlled Substance Registry consulted? Yes, I have consulted the [place] Controlled Substances Registry for this patient, and  feel the risk/benefit ratio today is favorable for proceeding with this prescription for tramadol.   Tommie SamsCook, Sister Carbone G, [place]DO 06/17/18 [phone]

## 2018-06-17 NOTE — Discharge Instructions (Addendum)
Labs unremarkable.  Xray with mild degenerative changes of the spine.  Chest xray clear.  Rest.  Medication as needed.  Follow up with PCP.  Take care  Dr. Adriana Simas

## 2018-06-17 NOTE — ED Triage Notes (Addendum)
Patient complains of shortness of breath and back pain that started over 1 week ago. Patient denies cough or fever. Patient states that she thinks the shortness of breath is caused by her back pain. Patient states that she was seen by [facility] urgent care and was told that they wouldn't see her due to respiratory symptoms.

## 2019-02-15 DIAGNOSIS — R1013 Epigastric pain: Secondary | ICD-10-CM | POA: Diagnosis not present

## 2019-02-15 DIAGNOSIS — R1084 Generalized abdominal pain: Secondary | ICD-10-CM | POA: Diagnosis not present

## 2019-02-15 DIAGNOSIS — R0989 Other specified symptoms and signs involving the circulatory and respiratory systems: Secondary | ICD-10-CM | POA: Diagnosis not present

## 2019-07-17 ENCOUNTER — Other Ambulatory Visit: Payer: Self-pay

## 2019-07-17 ENCOUNTER — Emergency Department
Admission: EM | Admit: 2019-07-17 | Discharge: 2019-07-17 | Disposition: A | Discharge status: Home / Self Care | Source: Ambulatory Visit | Attending: Nurse Practitioner | Admitting: Nurse Practitioner

## 2019-07-17 ENCOUNTER — Encounter (HOSPITAL_COMMUNITY): Payer: Self-pay

## 2019-07-17 DIAGNOSIS — K625 Hemorrhage of anus and rectum: Secondary | ICD-10-CM

## 2019-07-17 DIAGNOSIS — R112 Nausea with vomiting, unspecified: Secondary | ICD-10-CM

## 2019-07-17 LAB — CBC WITH DIFF
BASOPHIL #: <0.1 10*3/uL (ref <=0.20)
BASOPHIL %: 1 %
EOSINOPHIL #: 0.28 10*3/uL (ref <=0.50)
EOSINOPHIL %: 5 %
HCT: 39.3 % (ref 34.8–46.0)
HGB: 12.9 g/dL (ref 11.5–16.0)
IMMATURE GRANULOCYTE #: <0.1 10*3/uL (ref <0.10)
IMMATURE GRANULOCYTE %: 0 % (ref 0–1)
LYMPHOCYTE #: 2.2 10*3/uL (ref 1.00–4.80)
LYMPHOCYTE %: 41 %
MCH: 30 pg (ref 26.0–32.0)
MCHC: 32.8 g/dL (ref 31.0–35.5)
MCV: 91.4 fL (ref 78.0–100.0)
MONOCYTE #: 0.52 10*3/uL (ref 0.20–1.10)
MONOCYTE %: 10 %
MPV: 11.4 fL (ref 8.7–12.5)
NEUTROPHIL #: 2.26 10*3/uL (ref 1.50–7.70)
NEUTROPHIL %: 43 %
PLATELETS: 221 10*3/uL (ref 150–400)
RBC: 4.3 10*6/uL (ref 3.85–5.22)
RDW-CV: 13.2 % (ref 11.5–15.5)
WBC: 5.3 10*3/uL (ref 3.7–11.0)

## 2019-07-17 LAB — COMPREHENSIVE METABOLIC PANEL, NON-FASTING
ALBUMIN/GLOBULIN RATIO: 1.6 (ref >=1.0)
ALBUMIN: 3.9 g/dL (ref 3.5–5.2)
ALKALINE PHOSPHATASE: 52 U/L (ref 41–133)
ALT (SGPT): 17 U/L (ref 0–55)
ANION GAP: 4 mmol/L — ABNORMAL LOW (ref 6–15)
AST (SGOT): 23 U/L (ref 5–34)
BILIRUBIN TOTAL: 0.4 mg/dL (ref 0.2–1.2)
BUN: 13 mg/dL (ref 7–21)
CALCIUM: 8.9 mg/dL (ref 8.0–10.6)
CHLORIDE: 105 mmol/L (ref 98–107)
CO2 TOTAL: 30 mmol/L (ref 21–32)
CREATININE: 0.72 mg/dL — ABNORMAL LOW (ref 0.80–1.60)
ESTIMATED GFR: >60 mL/min/{1.73_m2}
GLUCOSE: 91 mg/dL (ref 70–100)
POTASSIUM: 4.4 mmol/L (ref 3.3–5.1)
PROTEIN TOTAL: 6.4 g/dL (ref 6.4–8.3)
SODIUM: 139 mmol/L (ref 136–146)

## 2019-07-17 LAB — URINALYSIS, MACROSCOPIC
BILIRUBIN: NEGATIVE mg/dL
BLOOD: NEGATIVE mg/dL
GLUCOSE: NEGATIVE mg/dL
KETONES: NEGATIVE mg/dL
LEUKOCYTES: NEGATIVE WBCs/uL
NITRITE: NEGATIVE
PH: 5.5 (ref 5.0–8.0)
PROTEIN: NEGATIVE mg/dL
SPECIFIC GRAVITY: 1.014 (ref 1.001–1.030)
UROBILINOGEN: 0.2 mg/dL (ref 0.2–1.0)

## 2019-07-17 LAB — OCCULT BLOOD, STOOL: OCCULT BLOOD: NEGATIVE

## 2019-07-17 LAB — LIPASE: LIPASE: 31 U/L (ref 8–78)

## 2019-07-17 LAB — URINALYSIS, MICROSCOPIC: BACTERIA: NEGATIVE /hpf

## 2019-07-17 LAB — RED TOP TUBE

## 2019-07-17 MED ORDER — SODIUM CHLORIDE 0.9 % IV BOLUS
1000.00 mL | INJECTION | Status: AC
Start: 2019-07-17 — End: 2019-07-17
  Administered 2019-07-17: 1000 mL via INTRAVENOUS

## 2019-07-17 MED ORDER — ONDANSETRON HCL (PF) 4 MG/2 ML INJECTION SOLUTION
4.00 mg | INTRAMUSCULAR | Status: AC
Start: 2019-07-17 — End: 2019-07-17
  Administered 2019-07-17: 4 mg via INTRAVENOUS
  Filled 2019-07-17: qty 2

## 2019-07-17 NOTE — ED Triage Notes (Signed)
[facility] SENT. VOMITIED LAST NIGHT. RECTAL BLEEDING.

## 2019-07-17 NOTE — ED Provider Notes (Signed)
[facility]       Name: Elaine Thompson  Age and Gender: 64 y.o. female  PCP: No Pcp    Chief Complaint:  Patient presents with     Chief Complaint   Patient presents with   . Vomiting   . Anal Bleeding       HPI    Elaine Thompson, date of birth 02-19-1955, is a 64 y.o. female who presents to the Emergency Department with a chief complaint of emesis x1 yesterday, rectal bleeding.  States she noted bright red blood via rectum at home x2.  Seen at [facility] this am and sent to ED      ROS:   Review of Systems   Constitutional: Negative.    HENT: Negative.    Gastrointestinal: Positive for anal bleeding and vomiting.   Genitourinary: Negative for difficulty urinating.   Musculoskeletal: Negative.    Skin: Negative.    Neurological: Negative.    Psychiatric/Behavioral: Negative.        PE:   ED Triage Vitals [07/17/19 0917]   BP (Non-Invasive) 135/85   Heart Rate 56   Respiratory Rate 16   Temperature 35.5 C (95.9 F)   SpO2 100 %   Weight 63.5 kg (139 lb 15.9 oz)   Height      Physical Exam  Constitutional:       Appearance: Normal appearance.   HENT:      Mouth/Throat:      Mouth: Mucous membranes are moist.   Pulmonary:      Effort: Pulmonary effort is normal.   Abdominal:      General: There is no distension.      Palpations: Abdomen is soft.      Tenderness: There is no abdominal tenderness. There is no guarding or rebound.   Genitourinary:     Rectum: Normal.      Comments: No obvious blood on rectal exam.  No hemorrhoids  Musculoskeletal:         General: Normal range of motion.   Skin:     General: Skin is warm and dry.   Neurological:      General: No focal deficit present.      Mental Status: She is alert.   Psychiatric:         Mood and Affect: Mood normal.           Past Medical History:  Diagnosis     History reviewed. No pertinent past medical history.    Past Surgical History:  History reviewed. No pertinent surgical history.    Family History: No family history on file.    Social History      Social History     Tobacco Use   . Smoking status: Never Smoker   . Smokeless tobacco: Never Used   Substance Use Topics   . Alcohol use: Never   . Drug use: Never       Social History     Substance and Sexual Activity   Drug Use Never       No Pcp    No Known Allergies      Diagnostics:    Labs:    Results for orders placed or performed during the hospital encounter of 07/17/19 (from the past 12 hour(s))   COMPREHENSIVE METABOLIC PANEL, NON-FASTING   Result Value Ref Range    SODIUM 139 136 - 146 mmol/L    POTASSIUM 4.4 3.3 - 5.1 mmol/L    CHLORIDE 105 98 -  107 mmol/L    CO2 TOTAL 30 21 - 32 mmol/L    ANION GAP 4 (L) 6 - 15 mmol/L    BUN 13 7 - 21 mg/dL    CREATININE 0.72 (L) 0.80 - 1.60 mg/dL    ESTIMATED GFR >60 mL/min/1.44m^2    ALBUMIN 3.9 3.5 - 5.2 g/dL    CALCIUM 8.9 8.0 - 10.6 mg/dL    GLUCOSE 91 70 - 100 mg/dL    ALKALINE PHOSPHATASE 52 41 - 133 U/L    ALT (SGPT) 17 0 - 55 U/L    AST (SGOT) 23 5 - 34 U/L    BILIRUBIN TOTAL 0.4 0.2 - 1.2 mg/dL    PROTEIN TOTAL 6.4 6.4 - 8.3 g/dL    ALBUMIN/GLOBULIN RATIO 1.6 >=1.0   LIPASE   Result Value Ref Range    LIPASE 31 8 - 78 U/L   CBC WITH DIFF   Result Value Ref Range    WBC 5.3 3.7 - 11.0 x10^3/uL    RBC 4.30 3.85 - 5.22 x10^6/uL    HGB 12.9 11.5 - 16.0 g/dL    HCT 39.3 34.8 - 46.0 %    MCV 91.4 78.0 - 100.0 fL    MCH 30.0 26.0 - 32.0 pg    MCHC 32.8 31.0 - 35.5 g/dL    RDW-CV 13.2 11.5 - 15.5 %    PLATELETS 221 150 - 400 x10^3/uL    MPV 11.4 8.7 - 12.5 fL    NEUTROPHIL % 43 %    LYMPHOCYTE % 41 %    MONOCYTE % 10 %    EOSINOPHIL % 5 %    BASOPHIL % 1 %    NEUTROPHIL # 2.26 1.50 - 7.70 x10^3/uL    LYMPHOCYTE # 2.20 1.00 - 4.80 x10^3/uL    MONOCYTE # 0.52 0.20 - 1.10 x10^3/uL    EOSINOPHIL # 0.28 <=0.50 x10^3/uL    BASOPHIL # <0.10 <=0.20 x10^3/uL    IMMATURE GRANULOCYTE % 0 0 - 1 %    IMMATURE GRANULOCYTE # <0.10 <0.10 x10^3/uL   OCCULT BLOOD, STOOL   Result Value Ref Range    OCCULT BLOOD Negative Negative   URINALYSIS, MACROSCOPIC   Result Value Ref Range     COLOR Yellow Yellow    APPEARANCE Clear Clear    SPECIFIC GRAVITY 1.014 1.001 - 1.030    PH 5.5 5.0 - 8.0    PROTEIN Negative Negative mg/dL    GLUCOSE Negative Negative mg/dL    KETONES Negative Negative mg/dL    UROBILINOGEN 0.2  0.2 - 1.0 mg/dL    BILIRUBIN Negative Negative mg/dL    BLOOD Negative Negative mg/dL    NITRITE Negative Negative    LEUKOCYTES Negative Negative WBCs/uL   URINALYSIS, MICROSCOPIC   Result Value Ref Range    SQUAMOUS EPITHELIAL None None /hpf    WBCS None None /hpf    RBCS None None /hpf    BACTERIA Negative Negative /hpf    HYALINE CASTS None None /lpf     Labs reviewed and interpreted by me.    Radiology:    No orders to display             Orders:  Orders Placed This Encounter   . CBC/DIFF   . COMPREHENSIVE METABOLIC PANEL, NON-FASTING   . LIPASE   . URINALYSIS, MACROSCOPIC AND MICROSCOPIC W/CULTURE REFLEX   . CBC WITH DIFF   . URINALYSIS, MACROSCOPIC   . URINALYSIS, MICROSCOPIC   . OCCULT BLOOD,  STOOL   . EXTRA TUBES   . RED TOP TUBE   . INSERT & MAINTAIN PERIPHERAL IV ACCESS   . NS bolus infusion 1,000 mL   . ondansetron (ZOFRAN) 2 mg/mL injection     Medications   NS bolus infusion 1,000 mL (1,000 mL Intravenous New Bag/New Syringe 07/17/19 0956)   ondansetron (ZOFRAN) 2 mg/mL injection (4 mg Intravenous Given 07/17/19 0956)          ED Course/MDM:     Medications given during ED stay include:  Medications   NS bolus infusion 1,000 mL (1,000 mL Intravenous New Bag/New Syringe 07/17/19 0956)   ondansetron (ZOFRAN) 2 mg/mL injection (4 mg Intravenous Given 07/17/19 0956)        Differential diagnosis includes, but is not limited to, gastritis, rectal bleed, hemorrhoid   Patient presents stating she had emesis x1 yesterday and noted rectal blood x2 since yesterday.  She states she went to [facility] this morning and then was sent to ED.  She reports mild nausea remains.  No further vomiting. No diarrhea.  Abdomen is soft non-tender without guarding or rebound.  Rectal exam shows no  hemorrhoids, no obvious blood.  I did proceed with labs and IV fluids.  Will add imaging if lab results are suggestive of further process.             Clinical Impression:   Encounter Diagnoses   Name Primary?   . Nausea and vomiting Yes   . Rectal bleeding        Disposition: Discharged            // Elnita Maxwell CESARIO CR-NP 07/17/2019 09:38  [facility] EMERGENCY MEDICINE  [facility]

## 2019-09-30 ENCOUNTER — Ambulatory Visit
Admission: EM | Admit: 2019-09-30 | Discharge: 2019-09-30 | Disposition: A | Discharge status: Home / Self Care | Payer: Federal, State, Local not specified - PPO

## 2019-09-30 ENCOUNTER — Other Ambulatory Visit: Payer: Self-pay

## 2019-09-30 ENCOUNTER — Encounter: Payer: Self-pay | Admitting: Emergency Medicine

## 2019-09-30 DIAGNOSIS — R112 Nausea with vomiting, unspecified: Secondary | ICD-10-CM

## 2019-09-30 DIAGNOSIS — R197 Diarrhea, unspecified: Secondary | ICD-10-CM | POA: Diagnosis not present

## 2019-09-30 DIAGNOSIS — R109 Unspecified abdominal pain: Secondary | ICD-10-CM

## 2019-09-30 NOTE — ED Provider Notes (Signed)
[facility]    CSN: [id number] Arrival date & time: 09/30/19  1191      History   Chief Complaint Chief Complaint  Patient presents with   Emesis   Diarrhea    HPI Kathy Kennedy is a 64 y.o. female.   Patient presents for nausea/vomiting and diarrhea over the past 2 days. She says she has taken Pepto Bismol with mild relief. Has also been trying to eat and that helps symptoms. Denies known COVID exposure. Has been fully vaccinated for COVID 19. Over the past 24 hours, she believes she has had about 5 episodes of diarrhea and only a couple episodes of vomiting. She says she has not had any diarrhea today. Denies fever. She admits to moderate upper abdominal pain. Denies blood in stool. She admits to some fatigue, denies dizziness. Denies dysuria, urgency. Admits to some frequency for awhile. Denies acute back pain. Denies cough, congestion, sore throat, chest pain, SOB. She says she wants to make sure she does not have something that could be contagious to others before going back to work. No other concerns.     Past Medical History:  Diagnosis Date   Stroke [facility])    3 years ago (mini stroke)    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 Authorizing Provider  aspirin 325 MG tablet Take 325 mg by mouth daily.   Yes [provider]  b complex vitamins tablet Take 1 tablet by mouth daily.   Yes [provider]  traMADol (ULTRAM) 50 MG tablet Take 1 tablet (50 mg total) by mouth every 12 (twelve) hours as needed. 06/17/18  Yes Cook, Jayce G, DO  VITAMIN A PO Take 1 tablet by mouth daily.   Yes [provider]  vitamin C (ASCORBIC ACID) 250 MG tablet Take 250 mg by mouth daily.   Yes [provider]  vitamin E 1000 UNIT capsule Take 1 capsule by mouth daily.   Yes [provider]    Family History Family History  Problem Relation Age of Onset   Hyperlipidemia Mother    Heart attack Father     Social History Social History   Tobacco Use   Smoking status: Former Smoker    Packs/day: 1.00  Years: 10.00    Pack years: 10.00    Quit date: 02/04/1984    Years since quitting: 35.6   Smokeless tobacco: Never Used  Vaping Use   Vaping Use: Never used  Substance Use Topics   Alcohol use: No   Drug use: No     Allergies   Molds & smuts   Review of Systems Review of Systems  Constitutional: Positive for fatigue. Negative for chills, diaphoresis and fever.  HENT: Negative for congestion, ear pain, rhinorrhea, sinus pressure, sinus pain and sore throat.   Respiratory: Negative for cough and shortness of breath.   Cardiovascular: Negative for chest pain.  Gastrointestinal: Positive for abdominal pain (epigastric), diarrhea, nausea and vomiting. Negative for anal bleeding.  Genitourinary: Positive for frequency. Negative for difficulty urinating and dysuria.  Musculoskeletal: Negative for arthralgias, back pain and  myalgias.  Skin: Negative for rash.  Neurological: Negative for dizziness, weakness and headaches.  Hematological: Negative for adenopathy.     Physical Exam Triage Vital Signs ED Triage Vitals  Enc Vitals Group     BP 09/30/19 1051 140/79     Pulse Rate 09/30/19 1051 86     Resp 09/30/19 1051 18     Temp 09/30/19 1051 97.8 F (36.6 C)     Temp Source 09/30/19 1051 Oral     SpO2 09/30/19 1051 100 %     Weight 09/30/19 1048 138 lb 0.1 oz (62.6 kg)     Height 09/30/19 1048 5\' 4"  (1.626 m)     Head Circumference --      Peak Flow --      Pain Score 09/30/19 1048 0     Pain Loc --      Pain Edu? --      Excl. in GC? --    No data found.  Updated Vital Signs BP 140/79 (BP Location: Right Arm)    Pulse 86    Temp 97.8 F (36.6 C) (Oral)    Resp 18    Ht 5\' 4"  (1.626 m)    Wt 138 lb 0.1 oz (62.6 kg)    LMP 02/03/2013    SpO2 100%    BMI 23.69 kg/m        Physical Exam Vitals and nursing note reviewed.  Constitutional:      General: She is not in acute distress.    Appearance: Normal appearance. She is not ill-appearing or toxic-appearing.  HENT:     Head: Normocephalic and atraumatic.  Eyes:     General: No scleral icterus.       Right eye: No discharge.        Left eye: No discharge.     Conjunctiva/sclera: Conjunctivae normal.  Cardiovascular:     Rate and Rhythm: Normal rate and regular rhythm.     Heart sounds: Normal heart sounds.  Pulmonary:     Effort: Pulmonary effort is normal. No respiratory distress.     Breath sounds: Normal breath sounds.  Abdominal:     General: Bowel sounds are normal.     Palpations: Abdomen is soft.     Tenderness: There is no abdominal tenderness. There is no guarding.     Comments: Normal abdominal exam  Musculoskeletal:     Cervical back: Neck supple.  Skin:    General: Skin is dry.  Neurological:     General: No focal deficit present.     Mental Status: She is alert. Mental status is at baseline.  Motor: No weakness.       Gait: Gait normal.  Psychiatric:        Mood and Affect: Mood normal.        Behavior: Behavior normal.        Thought Content: Thought content normal.      UC Treatments / Results  Labs (all labs ordered are listed, but only abnormal results are displayed) Labs Reviewed - No data to display  EKG   Radiology No results found.  Procedures Procedures (including critical care time)  Medications Ordered in UC Medications - No data to display  Initial Impression / Assessment and Plan / UC Course  I have reviewed the triage vital signs and the nursing notes.  Pertinent labs & imaging results that were available during my care of the patient were reviewed by me and considered in my medical decision making (see chart for details).   Patient's symptoms consistent with viral gastroenteritis vs GERD. Patient's exam normal and all VSS. No COVID exposure, no cough/fever/congestion/SOB/cehst pain. Patient not interested in COVID testing at this time and I really do not have any suspicion for COVID. Declines UA which I suggested for frequency. Declines nausea meds. She says she is ready to go back to work tomorrow. Advised rest, fluids, OTC meds. F/u as needed.  Final Clinical Impressions(s) / UC Diagnoses   Final diagnoses:  Nausea vomiting and diarrhea  Abdominal cramping     Discharge Instructions     -Increase rest and fluids. Continue Pepto as needed and consider Prilosec  ABDOMINAL PAIN: You may take Tylenol for pain relief. Use medications as directed including antiemetics and antidiarrheal medications if suggested or prescribed. You should increase fluids and electrolytes as well as rest over these next several days. If you have any questions or concerns, or if your symptoms are not improving or if especially if they acutely worsen, please call or stop back to the clinic immediately and we will be happy to help you or go to the ER   ABDOMINAL PAIN RED FLAGS: Seek  immediate further care if: symptoms remain the same or worsen over the next 3-7 days, you are unable to keep fluids down, you see blood or mucus in your stool, you vomit black or dark red material, you have a fever of 101.F or higher, you have localized and/or persistent abdominal pain      ED Prescriptions    None     PDMP not reviewed this encounter.   Shirlee Latch, PA-C 09/30/19 1122

## 2019-09-30 NOTE — ED Triage Notes (Signed)
Patient c/o vomiting and diarrhea that started on Tuesday. Denies any other symptoms.

## 2019-09-30 NOTE — Discharge Instructions (Addendum)
-  Increase rest and fluids. Continue Pepto as needed and consider Prilosec  ABDOMINAL PAIN: You may take Tylenol for pain relief. Use medications as directed including antiemetics and antidiarrheal medications if suggested or prescribed. You should increase fluids and electrolytes as well as rest over these next several days. If you have any questions or concerns, or if your symptoms are not improving or if especially if they acutely worsen, please call or stop back to the clinic immediately and we will be happy to help you or go to the ER   ABDOMINAL PAIN RED FLAGS: Seek immediate further care if: symptoms remain the same or worsen over the next 3-7 days, you are unable to keep fluids down, you see blood or mucus in your stool, you vomit black or dark red material, you have a fever of 101.F or higher, you have localized and/or persistent abdominal pain

## 2020-03-08 ENCOUNTER — Encounter: Payer: Self-pay | Admitting: Emergency Medicine

## 2020-03-08 ENCOUNTER — Other Ambulatory Visit: Payer: Self-pay

## 2020-03-08 ENCOUNTER — Ambulatory Visit
Admission: EM | Admit: 2020-03-08 | Discharge: 2020-03-08 | Disposition: A | Discharge status: Home / Self Care | Payer: Federal, State, Local not specified - PPO | Attending: Emergency Medicine | Admitting: Emergency Medicine

## 2020-03-08 DIAGNOSIS — J0100 Acute maxillary sinusitis, unspecified: Secondary | ICD-10-CM

## 2020-03-08 MED ORDER — AMOXICILLIN-POT CLAVULANATE 875-125 MG PO TABS: 1.0000 | ORAL_TABLET | Freq: Two times a day (BID) | ORAL | 0 refills | Status: AC

## 2020-03-08 NOTE — Discharge Instructions (Addendum)
Take the Augmentin twice daily for 10 days with food for the treatment of your sinus infection.  Perform sinus irrigation twice daily with a [employer] sinus rinse kit and distilled water.  Do not use tap water.  Increase your oral water intake to help thin your mucus out so is easier for your body to eliminate.  Take plain over-the-counter Mucinex every 6 hours to help break up how sticky your mucus is and again make it easier for your body to clear.  Return for reevaluation, or see your primary care provider, for any new or worsening symptoms.

## 2020-03-08 NOTE — ED Triage Notes (Addendum)
Patient c/o nasal discharge that started 2 weeks ago. She reports fatigue x 1 week.  Patient also reports dental pain that started several months ago. She feels this is sinus related. She declines COVID testing and states she will take a home test.

## 2020-03-08 NOTE — ED Provider Notes (Signed)
[facility]    CSN: [id number] Arrival date & time: 03/08/20  1807      History   Chief Complaint Chief Complaint  Patient presents with  . Nasal Congestion  . Fatigue  . Facial Pain    HPI Kathy Kennedy is a 65 y.o. female.   HPI   65 year old female here for evaluation of nasal discharge and fatigue.  Patient reports that she has been experiencing yellow-green nasal discharge for the past 2 weeks.  Her fatigue has been going on for the past week.  She also reports that she has been having pain in her left upper teeth for over a month.  Additionally, patient has had some left ear pressure.  Patient denies fever, sore throat, cough, or shortness of breath.  Past Medical History:  Diagnosis Date  . Stroke [facility])    3 years ago (mini stroke)    Patient Active Problem List   Diagnosis Date Noted  . Pure hypercholesterolemia 09/19/2017  . SOB (shortness of breath) on exertion 09/19/2017  . TIA (transient ischemic attack) 09/19/2017  . Chest pain with high risk for cardiac etiology 09/19/2017  . Depression 07/16/2016  . Plantar fasciitis 07/16/2016  . Fibromyalgia 07/16/2016  . 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  . Anxiety 05/08/2016  . Hx of hyperlipidemia 05/08/2016  . Allergic rhinitis 03/12/2016  . Chronic right shoulder pain 08/30/2015  . H/O transient cerebral ischemia 05/26/2014  . Hoarse 05/26/2014  . 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 Tendinitis of wrist 05/26/2014    Past Surgical History:  Procedure Laterality Date  . NO PAST SURGERIES      OB History    Gravida  0   Para  0   Term  0   Preterm  0   AB  0   Living  0     SAB  0   IAB  0   Ectopic  0   Multiple  0   Live Births               Home Medications    Prior to Admission medications   Medication Sig Start Date End Date Taking? Authorizing Provider  amoxicillin-clavulanate (AUGMENTIN) 875-125 MG tablet Take 1 tablet by mouth  every 12 (twelve) hours for 10 days. 03/08/20 03/18/20 Yes Margarette Canada, NP  aspirin 325 MG tablet Take 325 mg by mouth daily.   Yes [provider]  b complex vitamins tablet Take 1 tablet by mouth daily.   Yes [provider]  VITAMIN A PO Take 1 tablet by mouth daily.   Yes [provider]  vitamin C (ASCORBIC ACID) 250 MG tablet Take 250 mg by mouth daily.   Yes [provider]  vitamin E 1000 UNIT capsule Take 1 capsule by mouth daily.   Yes [provider]    Family History Family History  Problem Relation Age of Onset  . Hyperlipidemia Mother   . Heart attack Father     Social History Social History   Tobacco Use  . Smoking status: Former Smoker    Packs/day: 1.00    Years: 10.00    Pack years: 10.00    Quit date: 02/04/1984    Years since quitting: 36.1  . Smokeless tobacco: Never Used  Vaping Use  . Vaping Use: Never used  Substance Use Topics  . Alcohol use: No  . Drug use: No     Allergies   Molds & smuts  Review of Systems Review of Systems  Constitutional: Positive for fatigue. Negative for activity change, appetite change and fever.  HENT: Positive for ear pain, rhinorrhea, sinus pressure and sinus pain. Negative for sore throat.   Respiratory: Negative for cough, shortness of breath and wheezing.   Skin: Negative for rash.  Hematological: Negative.   Psychiatric/Behavioral: Negative.      Physical Exam Triage Vital Signs ED Triage Vitals  Enc Vitals Group     BP 03/08/20 1900 (!) 146/76     Pulse Rate 03/08/20 1900 67     Resp 03/08/20 1900 18     Temp 03/08/20 1900 97.8 F (36.6 C)     Temp Source 03/08/20 1900 Oral     SpO2 03/08/20 1900 100 %     Weight 03/08/20 1859 138 lb 0.1 oz (62.6 kg)     Height 03/08/20 1859 '5\' 4"'  (1.626 m)     Head Circumference --      Peak Flow --      Pain Score 03/08/20 1859 0     Pain Loc --      Pain Edu? --      Excl. in [place]? --    No data found.  Updated  Vital Signs BP (!) 146/76 (BP Location: Right Arm)   Pulse 67   Temp 97.8 F (36.6 C) (Oral)   Resp 18   Ht '5\' 4"'  (1.626 m)   Wt 138 lb 0.1 oz (62.6 kg)   LMP 02/03/2013   SpO2 100%   BMI 23.69 kg/m   Visual Acuity Right Eye Distance:   Left Eye Distance:   Bilateral Distance:    Right Eye Near:   Left Eye Near:    Bilateral Near:     Physical Exam Vitals and nursing note reviewed.  Constitutional:      General: She is not in acute distress.    Appearance: Normal appearance.  HENT:     Head: Normocephalic and atraumatic.     Right Ear: Tympanic membrane, ear canal and external ear normal.     Left Ear: Tympanic membrane, ear canal and external ear normal.     Nose: Congestion and rhinorrhea present.     Mouth/Throat:     Mouth: Mucous membranes are moist.     Pharynx: Oropharynx is clear. No oropharyngeal exudate or posterior oropharyngeal erythema.  Cardiovascular:     Rate and Rhythm: Normal rate and regular rhythm.     Pulses: Normal pulses.     Heart sounds: Normal heart sounds. No murmur heard. No gallop.   Pulmonary:     Effort: Pulmonary effort is normal.     Breath sounds: Normal breath sounds. No wheezing, rhonchi or rales.  Skin:    General: Skin is warm and dry.     Capillary Refill: Capillary refill takes less than 2 seconds.     Findings: No erythema or rash.  Neurological:     General: No focal deficit present.     Mental Status: She is alert and oriented to person, place, and time.  Psychiatric:        Mood and Affect: Mood normal.        Behavior: Behavior normal.        Thought Content: Thought content normal.        Judgment: Judgment normal.      UC Treatments / Results  Labs (all labs ordered are listed, but only abnormal results are displayed) Labs Reviewed - No data  to display  EKG   Radiology No results found.  Procedures Procedures (including critical care time)  Medications Ordered in UC Medications - No data to  display  Initial Impression / Assessment and Plan / UC Course  I have reviewed the triage vital signs and the nursing notes.  Pertinent labs & imaging results that were available during my care of the patient were reviewed by me and considered in my medical decision making (see chart for details).   Patient is here for evaluation of nasal discharge, fatigue, and pain in the left upper teeth that have been going on for the past 2 weeks plus.  Physical exam reveals erythematous edematous nasal mucosa left greater than right.  Patient also has tenderness to percussion of her left maxillary sinus.  Unable to visualize turbinates on the left.  Patient symptoms are consistent with maxillary sinusitis.  Will treat with Augmentin twice daily x10 days, sinus irrigation, Mucinex, and increased fluids to thin out mucus.   Final Clinical Impressions(s) / UC Diagnoses   Final diagnoses:  Acute non-recurrent maxillary sinusitis     Discharge Instructions     Take the Augmentin twice daily for 10 days with food for the treatment of your sinus infection.  Perform sinus irrigation twice daily with a [employer] sinus rinse kit and distilled water.  Do not use tap water.  Increase your oral water intake to help thin your mucus out so is easier for your body to eliminate.  Take plain over-the-counter Mucinex every 6 hours to help break up how sticky your mucus is and again make it easier for your body to clear.  Return for reevaluation, or see your primary care provider, for any new or worsening symptoms.    ED Prescriptions    Medication Sig Dispense Auth. Provider   amoxicillin-clavulanate (AUGMENTIN) 875-125 MG tablet Take 1 tablet by mouth every 12 (twelve) hours for 10 days. 20 tablet Margarette Canada, NP     PDMP not reviewed this encounter.   Margarette Canada, NP 03/08/20 1929

## 2020-07-21 IMAGING — CR CHEST - 2 VIEW
2 series · 2 of 2 positions shown · non-contrast
Comparison: Chest x-ray dated 09/03/2017.

CLINICAL DATA: Shortness of breath

EXAM:
CHEST - 2 VIEW

[chest pa]
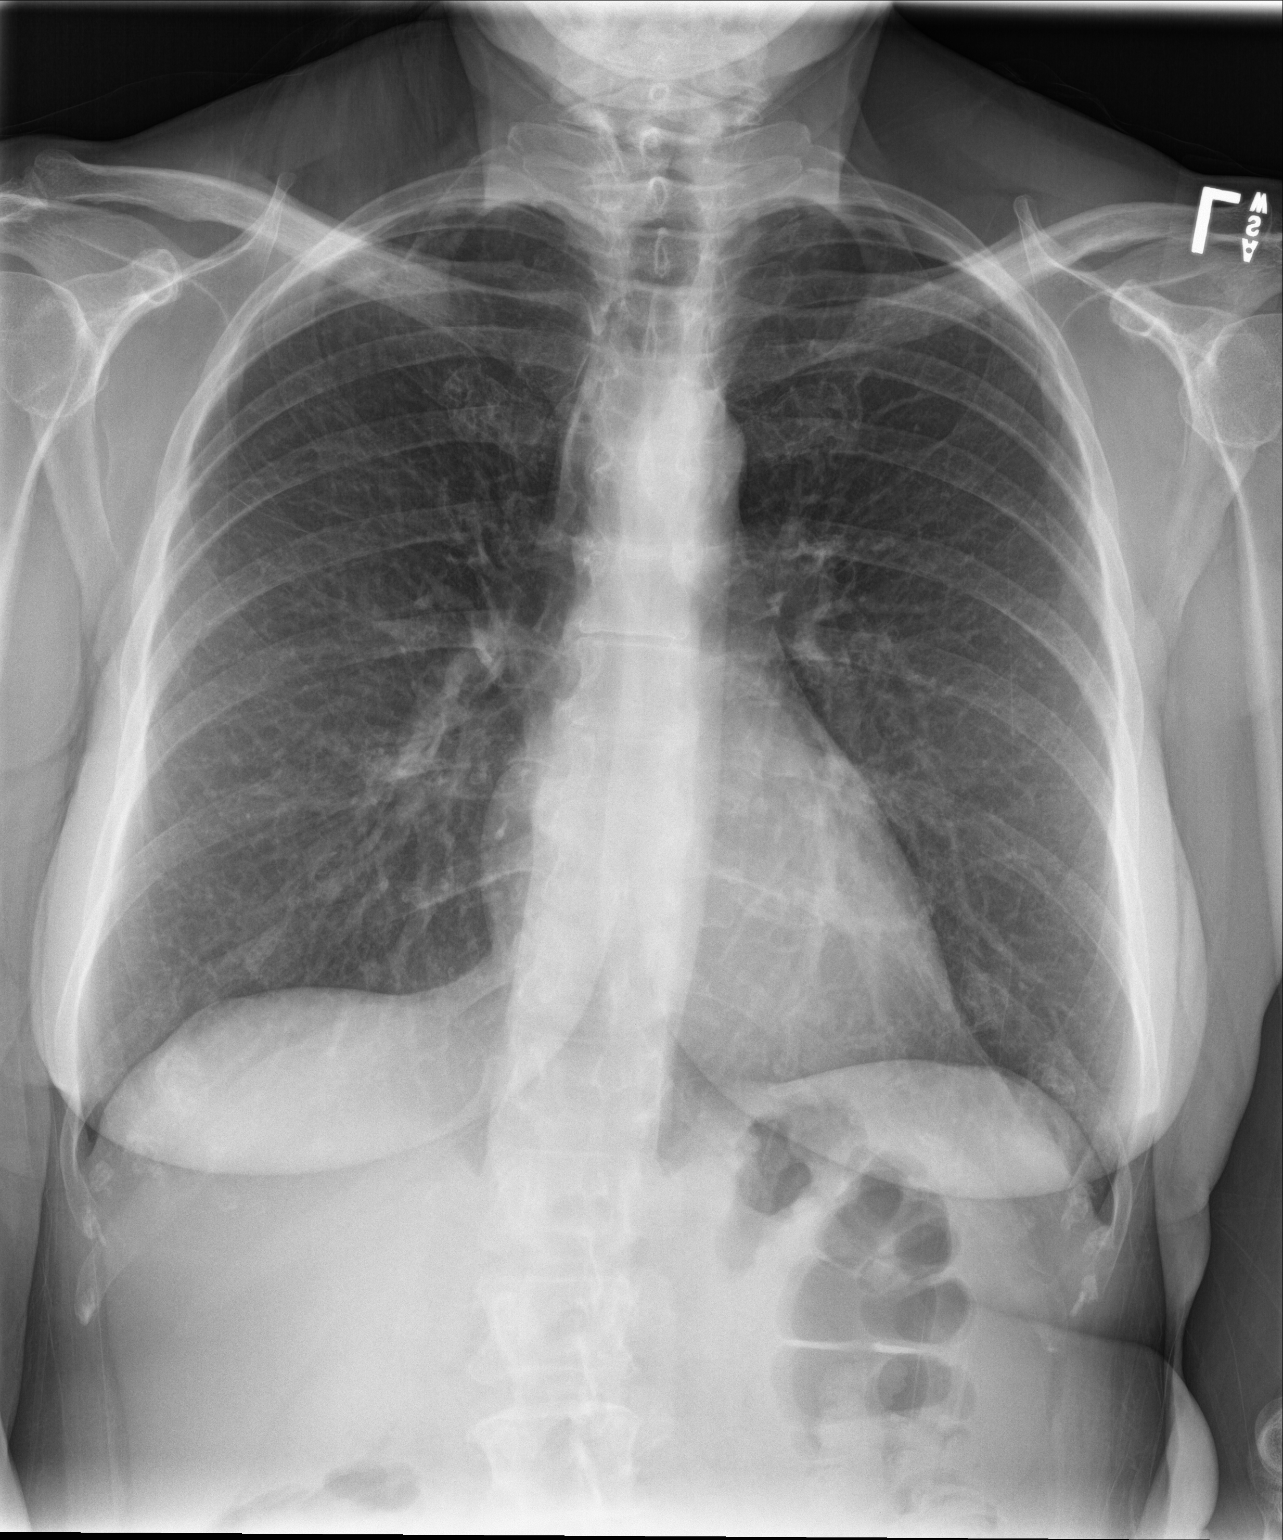

[chest lat]
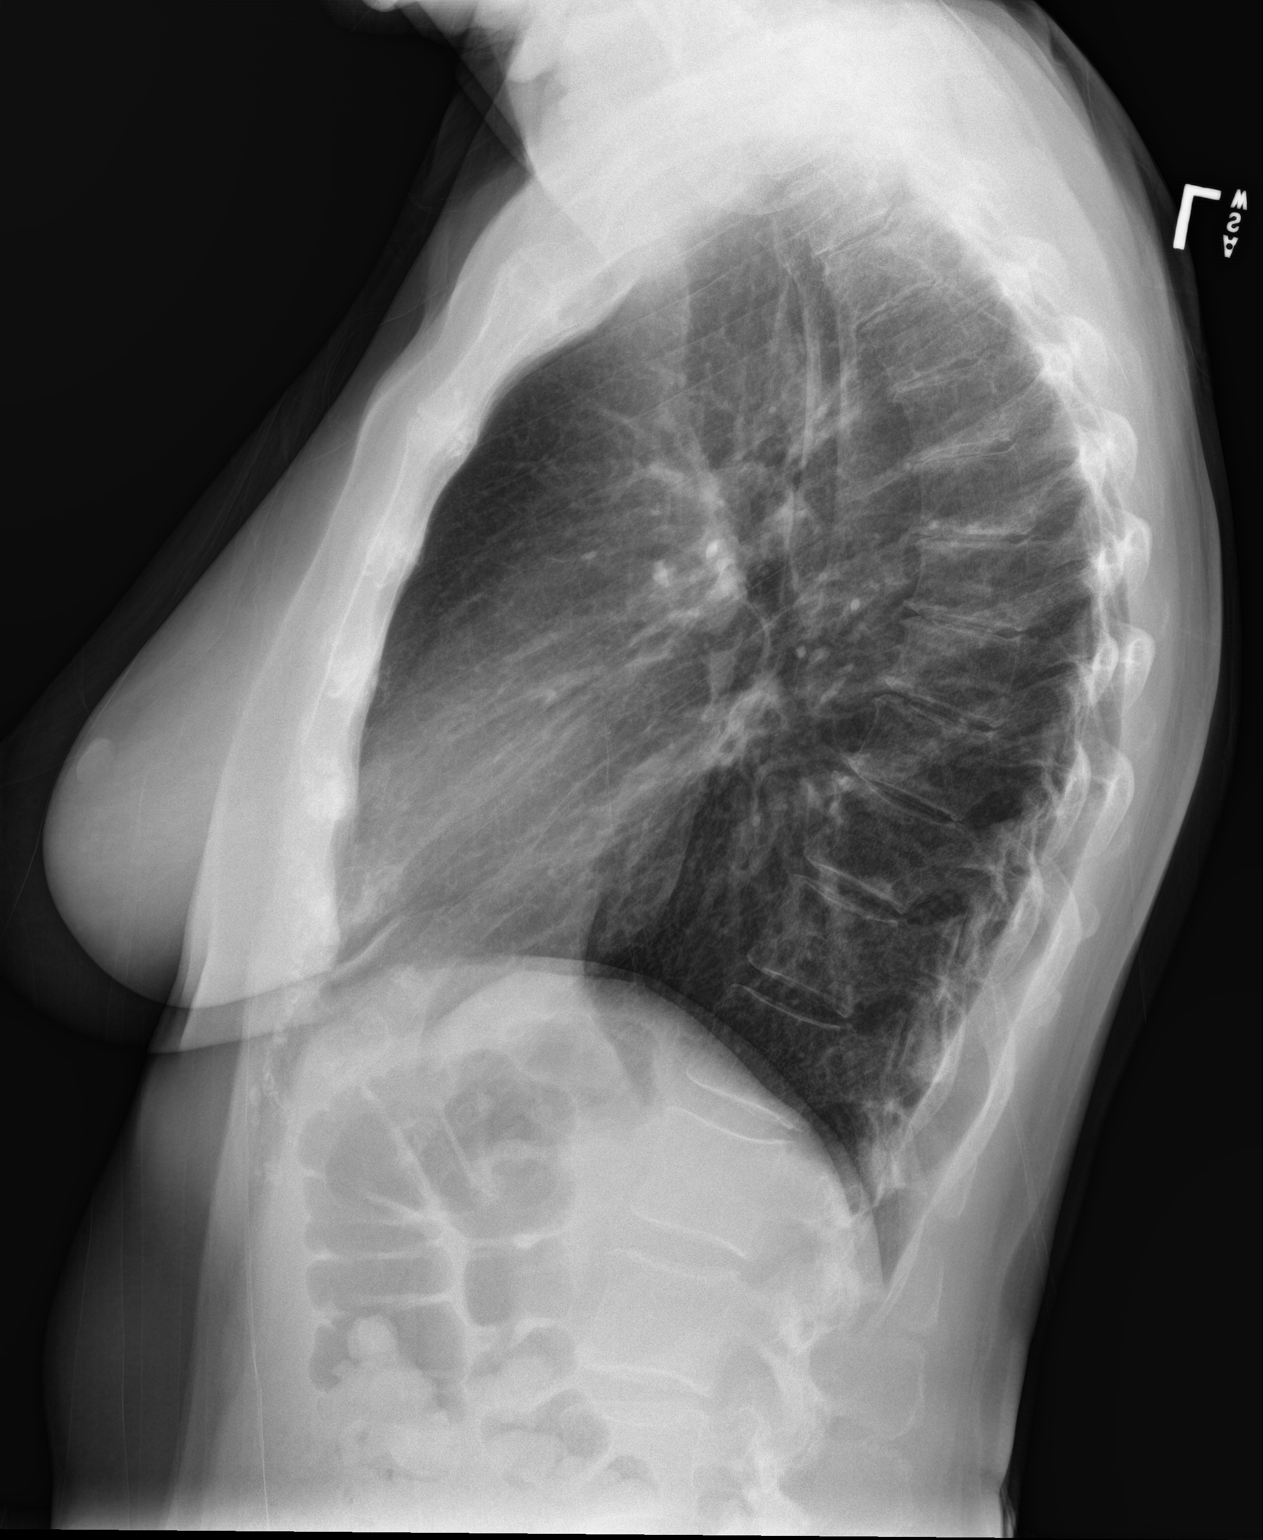

[2 of 2 positions shown; findings below may reference images not displayed]

FINDINGS: The heart size and mediastinal contours are within normal limits.
Both lungs are clear. The visualized skeletal structures are
unremarkable.
IMPRESSION: No active cardiopulmonary disease.

## 2020-12-17 ENCOUNTER — Other Ambulatory Visit: Payer: Self-pay

## 2020-12-17 ENCOUNTER — Ambulatory Visit
Admission: EM | Admit: 2020-12-17 | Discharge: 2020-12-17 | Disposition: A | Discharge status: Home / Self Care | Payer: Federal, State, Local not specified - PPO | Attending: Internal Medicine | Admitting: Internal Medicine

## 2020-12-17 ENCOUNTER — Encounter: Payer: Self-pay | Admitting: Emergency Medicine

## 2020-12-17 DIAGNOSIS — Z87891 Personal history of nicotine dependence: Secondary | ICD-10-CM | POA: Insufficient documentation

## 2020-12-17 DIAGNOSIS — Z20822 Contact with and (suspected) exposure to covid-19: Secondary | ICD-10-CM | POA: Diagnosis not present

## 2020-12-17 DIAGNOSIS — J069 Acute upper respiratory infection, unspecified: Secondary | ICD-10-CM

## 2020-12-17 DIAGNOSIS — R059 Cough, unspecified: Secondary | ICD-10-CM | POA: Insufficient documentation

## 2020-12-17 DIAGNOSIS — J029 Acute pharyngitis, unspecified: Secondary | ICD-10-CM

## 2020-12-17 LAB — RESP PANEL BY RT-PCR (FLU A&B, COVID) ARPGX2
Influenza A by PCR: NEGATIVE
Influenza B by PCR: NEGATIVE
SARS Coronavirus 2 by RT PCR: NEGATIVE

## 2020-12-17 LAB — GROUP A STREP BY PCR: Group A Strep by PCR: NOT DETECTED

## 2020-12-17 MED ORDER — BENZONATATE 200 MG PO CAPS: 200.0000 mg | ORAL_CAPSULE | Freq: Three times a day (TID) | ORAL | 0 refills | Status: DC | PRN

## 2020-12-17 MED ORDER — AZITHROMYCIN 500 MG PO TABS: 500.0000 mg | ORAL_TABLET | Freq: Every day | ORAL | 0 refills | Status: DC

## 2020-12-17 NOTE — Discharge Instructions (Addendum)
Your flu, covid and strep A test are negative. You have a cold and could have a different strand of bacteria causing your sore throat, so I will have you try Azithromycin antibiotic for that.  You may do saline nose rinses for the runny nose and congestion twice a day for 5- 7 days.  Looke up how to do [employer] saline rinses on YouTube.

## 2020-12-17 NOTE — ED Provider Notes (Signed)
[facility]    CSN: [id number] Arrival date & time: 12/17/20  0809      History   Chief Complaint Chief Complaint  Patient presents with   Cough   Nasal Congestion    HPI Kathy Kennedy is a 65 y.o. female who presents with nose congestion, cough and ST x 3 days ago. She denies fever, Had negative covid test yesterday. He appetite is down and only been snacking due to her throat being so sore. Has had a couple of co-workers who had covid. Her cough is non productive and causing her anterior ribs to be sore from coughing so much.     Past Medical History:  Diagnosis Date   Stroke [facility]    3 years ago (mini stroke)    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 Authorizing Provider  aspirin 325 MG tablet Take 325 mg by mouth daily.   Yes [provider]  atorvastatin (LIPITOR) 10 MG tablet  12/01/20  Yes [provider]  azithromycin (ZITHROMAX) 500 MG tablet Take 1 tablet (500 mg  total) by mouth daily. 12/17/20  Yes Rodriguez-Southworth, Nettie Elm, PA-C  b complex vitamins tablet Take 1 tablet by mouth daily.   Yes [provider]  benzonatate (TESSALON) 200 MG capsule Take 1 capsule (200 mg total) by mouth 3 (three) times daily as needed for cough. 12/17/20  Yes Rodriguez-Southworth, Nettie Elm, PA-C  omeprazole (PRILOSEC) 40 MG capsule  10/30/20  Yes [provider]  VITAMIN A PO Take 1 tablet by mouth daily.   Yes [provider]  vitamin C (ASCORBIC ACID) 250 MG tablet Take 250 mg by mouth daily.   Yes [provider]  vitamin E 1000 UNIT capsule Take 1 capsule by mouth daily.   Yes [provider]    Family History Family History  Problem Relation Age of Onset   Hyperlipidemia Mother    Heart attack Father     Social History Social History   Tobacco Use   Smoking status: Former    Packs/day: 1.00    Years: 10.00    Pack years: 10.00    Types: Cigarettes    Quit date: 02/04/1984    Years since quitting: 36.8   Smokeless  tobacco: Never  Vaping Use   Vaping Use: Never used  Substance Use Topics   Alcohol use: No   Drug use: No     Allergies   Molds & smuts   Review of Systems Review of Systems  Constitutional:  Positive for appetite change and fatigue. Negative for chills, diaphoresis and fever.  HENT:  Positive for congestion, postnasal drip and sore throat. Negative for ear discharge, ear pain and trouble swallowing.   Eyes:  Negative for discharge.  Respiratory:  Positive for cough. Negative for chest tightness and shortness of breath.   Cardiovascular:  Negative for chest pain.  Gastrointestinal:  Negative for diarrhea, nausea and vomiting.  Musculoskeletal:  Negative for myalgias.  Skin:  Negative for rash.  Neurological:  Negative for headaches.  Hematological:  Negative for adenopathy.    Physical Exam Triage Vital Signs ED Triage Vitals  Enc Vitals Group     BP 12/17/20 0846 (!) 155/84      Pulse Rate 12/17/20 0846 70     Resp 12/17/20 0846 14     Temp 12/17/20 0846 97.8 F (36.6 C)     Temp Source 12/17/20 0846 Oral     SpO2 12/17/20 0846 100 %     Weight 12/17/20 0842 138 lb (62.6 kg)     Height 12/17/20 0842 5\' 4"  (1.626 m)     Head Circumference --      Peak Flow --      Pain Score 12/17/20 0842 0     Pain Loc --      Pain Edu? --      Excl. in [place]? --    No data found.  Updated Vital Signs BP (!) 155/84 (BP Location: Left Arm)   Pulse 70   Temp 97.8 F (36.6 C) (Oral)   Resp 14   Ht 5\' 4"  (1.626 m)   Wt 138 lb (62.6 kg)   LMP 02/03/2013   SpO2 100%   BMI 23.69 kg/m   Visual Acuity Right Eye Distance:   Left Eye Distance:   Bilateral Distance:    Right Eye Near:   Left Eye Near:    Bilateral Near:     Physical Exam Physical Exam Vitals signs and nursing note reviewed.  Constitutional:      General: She is not in acute distress.    Appearance: Normal appearance. She is not ill-appearing, toxic-appearing or diaphoretic.  HENT:     Head: Normocephalic.     Right Ear: Tympanic membrane, ear canal and external ear normal.     Left Ear: Tympanic membrane, ear canal and external ear normal.     Nose: Nose normal.     Mouth/Throat: erythematous pharynx with no exudate. Uvula is midline    Mouth: Mucous membranes are moist.  Eyes:     General: No scleral icterus.       Right eye: No discharge.        Left eye: No discharge.     Conjunctiva/sclera: Conjunctivae normal.  Neck:     Musculoskeletal: Neck supple. No neck rigidity. Anterior cervical chain nodes are a little enlarged and tender.  Cardiovascular:     Rate and Rhythm: Normal rate and regular rhythm.     Heart sounds: No murmur.  Pulmonary:     Effort: Pulmonary effort is normal.     Breath sounds: Normal breath sounds.  Musculoskeletal: Normal range of motion.  Lymphadenopathy:     Cervical: No cervical adenopathy.  Skin:  General: Skin is warm and dry.     Coloration: Skin is  not jaundiced.     Findings: No rash.  Neurological:     Mental Status: She is alert and oriented to person, place, and time.     Gait: Gait normal.  Psychiatric:        Mood and Affect: Mood normal.        Behavior: Behavior normal.        Thought Content: Thought content normal.        Judgment: Judgment normal.    UC Treatments / Results  Labs (all labs ordered are listed, but only abnormal results are displayed) Labs Reviewed  RESP PANEL BY RT-PCR (FLU A&B, COVID) [id number]  GROUP A STREP BY PCR    EKG   Radiology No results found.  Procedures Procedures (including critical care time)  Medications Ordered in UC Medications - No data to display  Initial Impression / Assessment and Plan / UC Course  I have reviewed the triage vital signs and the nursing notes.  Pertinent labs results that were available during my care of the patient were reviewed by me and considered in my medical decision making (see chart for details).  Clinical Course as of 12/17/20 1001  Sun Dec 17, 2020  8756 Resp Panel by RT-PCR (Flu A&B, Covid) Nasopharyngeal Swab [SR]    Clinical Course User Index [SR] Rodriguez-Southworth, Nettie Elm, PA-C   Has neg Flu A&B, Covid and Strep A  Final Clinical Impressions(s) / UC Diagnoses   Final diagnoses:  Viral URI with cough  Acute pharyngitis, unspecified etiology     Discharge Instructions      Your flu, covid and strep A test are negative. You have a cold and could have a different strand of bacteria causing your sore throat, so I will have you try Azithromycin antibiotic for that.  You may do saline nose rinses for the runny nose and congestion twice a day for 5- 7 days.  Looke up how to do [employer] saline rinses on YouTube.     I prescribed her Azithromycin to cover other bacteria that cause pharyngitis and Also sent Tessalon as noted   ED Prescriptions     Medication Sig Dispense Auth. Provider   azithromycin (ZITHROMAX) 500 MG tablet  Take 1 tablet (500 mg total) by mouth daily. 5 tablet Rodriguez-Southworth, Shoni Quijas, PA-C   benzonatate (TESSALON) 200 MG capsule Take 1 capsule (200 mg total) by mouth 3 (three) times daily as needed for cough. 30 capsule Rodriguez-Southworth, Nettie Elm, PA-C      PDMP not reviewed this encounter.   Garey Ham, PA-C 12/17/20 1003

## 2020-12-17 NOTE — ED Triage Notes (Signed)
Patient c/o nasal congestion, cough and sore throat that started on Thursday.  Patient denies fevers. Patient took a home covid test yesterday and was negative.

## 2021-01-01 ENCOUNTER — Other Ambulatory Visit: Payer: Self-pay

## 2021-01-01 ENCOUNTER — Encounter: Payer: Self-pay | Admitting: Emergency Medicine

## 2021-01-01 ENCOUNTER — Ambulatory Visit
Admission: EM | Admit: 2021-01-01 | Discharge: 2021-01-01 | Disposition: A | Discharge status: Home / Self Care | Payer: Federal, State, Local not specified - PPO | Attending: Emergency Medicine | Admitting: Emergency Medicine

## 2021-01-01 DIAGNOSIS — J40 Bronchitis, not specified as acute or chronic: Secondary | ICD-10-CM | POA: Diagnosis not present

## 2021-01-01 MED ORDER — BENZONATATE 100 MG PO CAPS: 200.0000 mg | ORAL_CAPSULE | Freq: Three times a day (TID) | ORAL | 0 refills | Status: DC

## 2021-01-01 MED ORDER — PROMETHAZINE-DM 6.25-15 MG/5ML PO SYRP: 5.0000 mL | ORAL_SOLUTION | Freq: Four times a day (QID) | ORAL | 0 refills | Status: DC | PRN

## 2021-01-01 MED ORDER — DOXYCYCLINE HYCLATE 100 MG PO CAPS: 100.0000 mg | ORAL_CAPSULE | Freq: Two times a day (BID) | ORAL | 0 refills | Status: AC

## 2021-01-01 NOTE — Discharge Instructions (Addendum)
Take the Doxycycline twice daily with food for 7 days.   Use the Tessalon Perles every 8 hours for your cough.  Taken with a small sip of water.  They may give you some numbness to the base of your tongue or metallic taste in her mouth, this is normal.  They are designed to calm down the cough reflex.  Use the Promethazine DM cough syrup at bedtime as will make you drowsy.  You may take 1 teaspoon (5 mL) every 6 hours.  Return for reevaluation for new or worsening symptoms.

## 2021-01-01 NOTE — ED Triage Notes (Signed)
Pt presents today with c/o chest congestion with cough with bilateral rib pain x 3 weeks with increase over past week. Denies fever.

## 2021-01-01 NOTE — ED Notes (Signed)
Called in from parking lot  

## 2021-01-01 NOTE — ED Provider Notes (Signed)
[facility]    CSN: [id number] Arrival date & time: 01/01/21  9562      History   Chief Complaint Chief Complaint  Patient presents with   Cough   rib pain - bilateral    HPI Kathy Kennedy is a 65 y.o. female.   HPI  65 year old female here for reevaluation of respiratory complaints.  Patient reports that she has been experiencing cough for the last 3 weeks.  She was evaluated on 12/17/2020 and diagnosed with acute pharyngitis and an upper respiratory infection.  She had a negative strep, flu, and COVID test at that time.  She was treated with azithromycin and Tessalon Perles.  She reports that her sore throat got better but then her husband got an upper respiratory infection and she believes she caught what ever he had.  She states that she feels stuff come up in her chest but she cannot expectorate it.  She denies any shortness of breath or wheezing.  She also denies any fever, runny nose, nasal congestion, ear pain, or sore throat.  Past Medical History:  Diagnosis Date   Stroke [facility])    3 years ago (mini stroke)    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 Authorizing Provider  benzonatate (TESSALON) 100 MG capsule Take 2 capsules (200 mg total) by mouth every 8 (eight) hours. 01/01/21  Yes Becky Augusta, NP  doxycycline (VIBRAMYCIN) 100 MG capsule Take 1 capsule (100 mg total) by mouth 2 (two) times daily for 7 days. 01/01/21 01/08/21 Yes Becky Augusta, NP  promethazine-dextromethorphan (PROMETHAZINE-DM) 6.25-15 MG/5ML syrup Take 5 mLs by mouth 4 (four) times daily as needed. 01/01/21  Yes Becky Augusta, NP  aspirin 325 MG tablet Take 325 mg by mouth daily.    [provider]  atorvastatin (LIPITOR) 10 MG tablet  12/01/20   [provider]  b complex vitamins tablet Take 1 tablet by mouth daily.    [provider]  omeprazole (PRILOSEC) 40 MG capsule  10/30/20   [provider]  VITAMIN A PO Take 1 tablet by mouth daily.    [provider]  vitamin C (ASCORBIC ACID) 250 MG tablet Take 250 mg by mouth daily.    [provider]  vitamin E 1000 UNIT capsule Take 1 capsule by mouth daily.    [provider]    Family History Family History  Problem Relation Age of Onset   Hyperlipidemia Mother    Heart attack Father     Social History Social History   Tobacco Use   Smoking status: Former    Packs/day: 1.00    Years: 10.00    Pack years: 10.00    Types: Cigarettes    Quit date: 02/04/1984    Years since quitting: 36.9   Smokeless tobacco: Never  Vaping Use   Vaping Use: Never used  Substance Use Topics   Alcohol use: No   Drug use: No     Allergies   Molds & smuts   Review of Systems Review of Systems  Constitutional:  Negative for activity change, appetite change and fever.  HENT:  Negative for congestion, ear pain, rhinorrhea and sore throat.   Respiratory:  Positive for cough. Negative for shortness  of breath and wheezing.     Physical Exam Triage Vital Signs ED Triage Vitals  Enc Vitals Group     BP 01/01/21 1034 139/78     Pulse Rate 01/01/21 1034 61     Resp 01/01/21 1034 16     Temp 01/01/21 1034 98 F (36.7 C)     Temp Source 01/01/21 1034 Oral     SpO2 01/01/21 1034 100 %     Weight --      Height --      Head Circumference --      Peak Flow --      Pain Score 01/01/21 1037 5     Pain Loc --      Pain Edu? --      Excl. in [place]? --    No data found.  Updated Vital Signs BP 139/78 (BP Location: Left Arm)   Pulse 61   Temp 98 F (36.7 C) (Oral)   Resp 16   LMP 02/03/2013   SpO2 100%   Visual Acuity Right Eye Distance:   Left Eye Distance:   Bilateral Distance:    Right Eye Near:   Left Eye Near:    Bilateral Near:     Physical Exam Vitals and nursing note reviewed.  Constitutional:      Appearance: Normal appearance. She is normal weight. She is not ill-appearing.  HENT:     Head: Normocephalic and atraumatic.  Cardiovascular:     Rate and Rhythm: Normal rate and regular rhythm.     Pulses: Normal pulses.     Heart sounds: Normal heart sounds. No murmur heard.   No gallop.  Pulmonary:     Effort: Pulmonary effort is normal.     Breath sounds: Rhonchi present. No wheezing or rales.  Skin:    General: Skin is warm and dry.     Capillary Refill: Capillary refill takes less than 2 seconds.     Findings: No erythema or rash.  Neurological:     General: No focal deficit present.     Mental Status: She is alert and oriented to person, place, and time.  Psychiatric:        Mood and Affect: Mood normal.        Behavior: Behavior normal.        Thought Content: Thought content normal.        Judgment: Judgment normal.     UC Treatments / Results  Labs (all labs ordered are listed,  but only abnormal results are displayed) Labs Reviewed - No data to display  EKG   Radiology No results found.  Procedures Procedures (including critical care  time)  Medications Ordered in UC Medications - No data to display  Initial Impression / Assessment and Plan / UC Course  I have reviewed the triage vital signs and the nursing notes.  Pertinent labs & imaging results that were available during my care of the patient were reviewed by me and considered in my medical decision making (see chart for details).  Patient is a nontoxic-appearing 66 year old female here for reevaluation of respiratory complaints that been ongoing for the past 3 weeks.  She is also complaining of pain in bilateral ribs but when she indicates which is evidence of pain she is indicating more her upper diaphragm.  Patient has no upper respiratory symptoms.  Physical exam reveals fine rhonchi in her middle and upper lung fields bilaterally.  No wheezes appreciated.  No rales appreciated.  Patient has normal respiratory cycle, has not coughed while up in the exam room, and is not exhibiting any dyspnea.  She is not had any sputum expectoration or fever.  I believe the patient has bronchitis based on her physical exam, recent respiratory illness, and description of symptoms.  We will treat her accordingly with doxycycline to cover any potential bacterial sources as her symptoms have been going on for 3 weeks, Tessalon Perles, and Promethazine DM cough syrup use at bedtime.  She is not having any wheezing or shortness of breath so I do not feel that she needs to have an albuterol inhaler at this time.  Patient Darin Engels return for new or worsening symptoms.   Final Clinical Impressions(s) / UC Diagnoses   Final diagnoses:  Bronchitis     Discharge Instructions      Take the Doxycycline twice daily with food for 7 days.   Use the Tessalon Perles every 8 hours for your cough.  Taken with a small sip of water.  They may give you some numbness to the base of your tongue or metallic taste in her mouth, this is normal.  They are designed to calm down the cough reflex.  Use the  Promethazine DM cough syrup at bedtime as will make you drowsy.  You may take 1 teaspoon (5 mL) every 6 hours.  Return for reevaluation for new or worsening symptoms.      ED Prescriptions     Medication Sig Dispense Auth. Provider   doxycycline (VIBRAMYCIN) 100 MG capsule Take 1 capsule (100 mg total) by mouth 2 (two) times daily for 7 days. 14 capsule Becky Augusta, NP   benzonatate (TESSALON) 100 MG capsule Take 2 capsules (200 mg total) by mouth every 8 (eight) hours. 21 capsule Becky Augusta, NP   promethazine-dextromethorphan (PROMETHAZINE-DM) 6.25-15 MG/5ML syrup Take 5 mLs by mouth 4 (four) times daily as needed. 118 mL Becky Augusta, NP      PDMP not reviewed this encounter.   Becky Augusta, NP 01/01/21 [phone]

## 2022-02-11 ENCOUNTER — Ambulatory Visit
Admission: EM | Admit: 2022-02-11 | Discharge: 2022-02-11 | Disposition: A | Discharge status: Home / Self Care | Payer: Federal, State, Local not specified - PPO | Attending: Internal Medicine | Admitting: Internal Medicine

## 2022-02-11 DIAGNOSIS — R300 Dysuria: Secondary | ICD-10-CM | POA: Insufficient documentation

## 2022-02-11 LAB — URINALYSIS, ROUTINE W REFLEX MICROSCOPIC
Bilirubin Urine: NEGATIVE
Glucose, UA: NEGATIVE mg/dL
Hgb urine dipstick: NEGATIVE
Ketones, ur: NEGATIVE mg/dL
Nitrite: NEGATIVE
Protein, ur: NEGATIVE mg/dL
Specific Gravity, Urine: 1.01 (ref 1.005–1.030)
pH: 6 (ref 5.0–8.0)

## 2022-02-11 LAB — WET PREP, GENITAL
Clue Cells Wet Prep HPF POC: NONE SEEN
Sperm: NONE SEEN
Trich, Wet Prep: NONE SEEN
WBC, Wet Prep HPF POC: <10 — AB (ref <10)
Yeast Wet Prep HPF POC: NONE SEEN

## 2022-02-11 LAB — URINALYSIS, MICROSCOPIC (REFLEX): RBC / HPF: NONE SEEN RBC/hpf (ref 0–5)

## 2022-02-11 MED ORDER — PHENAZOPYRIDINE HCL 200 MG PO TABS: 200.0000 mg | ORAL_TABLET | Freq: Three times a day (TID) | ORAL | 0 refills | Status: AC

## 2022-02-11 MED ORDER — PHENAZOPYRIDINE HCL 200 MG PO TABS: 200.0000 mg | ORAL_TABLET | Freq: Three times a day (TID) | ORAL | 0 refills | Status: DC

## 2022-02-11 NOTE — ED Provider Notes (Signed)
[facility]    CSN: [id number] Arrival date & time: 02/11/22  1921      History   Chief Complaint Chief Complaint  Patient presents with   Urinary Tract Infection    HPI Kathy Kennedy is a 67 y.o. female.   HPI  18 old female here for evaluation of urinary complaints.  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  For last 3 days she has been experiencing urinary frequency as well as burning with urination and urgency.  She denies seeing any blood in her urine or any cloudiness to her urine.  No changes to the urine odor.  She also denies fever, nausea, vomiting, or vaginal discharge or itching.  She has had some mild suprapubic pain.  Past Medical History:  Diagnosis Date   Stroke [facility])    3 years ago (mini stroke)    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 Authorizing Provider  aspirin 325 MG tablet Take 325 mg by mouth daily.   Yes  [provider]  atorvastatin (LIPITOR) 10 MG tablet  12/01/20  Yes [provider]  b complex vitamins tablet Take 1 tablet by mouth daily.   Yes [provider]  benzonatate (TESSALON) 100 MG capsule Take 2 capsules (200 mg total) by mouth every 8 (eight) hours. 01/01/21  Yes Becky Augusta, NP  omeprazole (PRILOSEC) 40 MG capsule  10/30/20  Yes [provider]  promethazine-dextromethorphan (PROMETHAZINE-DM) 6.25-15 MG/5ML syrup Take 5 mLs by mouth 4 (four) times daily as needed. 01/01/21  Yes Becky Augusta, NP  VITAMIN A PO Take 1 tablet by mouth daily.   Yes [provider]  vitamin C (ASCORBIC ACID) 250 MG tablet Take 250 mg by mouth daily.   Yes [provider]  vitamin E 1000 UNIT capsule Take 1 capsule by mouth daily.   Yes [provider]  phenazopyridine (PYRIDIUM) 200 MG tablet Take 1 tablet (200 mg total) by mouth 3 (three) times daily. 02/11/22   Becky Augusta, NP    Family History Family History  Problem Relation Age of Onset   Hyperlipidemia Mother    Heart attack Father  Social History Social History   Tobacco Use   Smoking status: Former    Packs/day: 1.00    Years: 10.00    Total pack years: 10.00    Types: Cigarettes    Quit date: 02/04/1984    Years since quitting: 38.0   Smokeless tobacco: Never  Vaping Use   Vaping Use: Never used  Substance Use Topics   Alcohol use: No   Drug use: No     Allergies   Molds & smuts   Review of Systems Review of Systems  Constitutional:  Negative for fever.  Gastrointestinal:  Positive for abdominal pain. Negative for nausea and vomiting.  Genitourinary:  Positive for dysuria, frequency and urgency. Negative for hematuria, vaginal discharge and vaginal pain.  Musculoskeletal:  Negative for back pain.     Physical Exam Triage Vital Signs ED Triage Vitals  Enc Vitals Group     BP 02/11/22 1931 (!) 165/90     Pulse Rate 02/11/22 1931 74     Resp  02/11/22 1931 16     Temp 02/11/22 1931 97.9 F (36.6 C)     Temp Source 02/11/22 1931 Oral     SpO2 02/11/22 1931 100 %     Weight 02/11/22 1930 138 lb (62.6 kg)     Height 02/11/22 1930 5\' 4"  (1.626 m)     Head Circumference --      Peak Flow --      Pain Score 02/11/22 1930 6     Pain Loc --      Pain Edu? --      Excl. in [place]? --    No data found.  Updated Vital Signs BP (!) 165/90 (BP Location: Left Arm)   Pulse 74   Temp 97.9 F (36.6 C) (Oral)   Resp 16   Ht 5\' 4"  (1.626 m)   Wt 138 lb (62.6 kg)   LMP 02/03/2013   SpO2 100%   BMI 23.69 kg/m   Visual Acuity Right Eye Distance:   Left Eye Distance:   Bilateral Distance:    Right Eye Near:   Left Eye Near:    Bilateral Near:     Physical Exam Vitals and nursing note reviewed.  Constitutional:      Appearance: Normal appearance. She is not ill-appearing.  HENT:     Head: Normocephalic and atraumatic.  Cardiovascular:     Rate and Rhythm: Normal rate and regular rhythm.     Pulses: Normal pulses.     Heart sounds: Normal heart sounds. No murmur heard.    No friction rub. No gallop.  Pulmonary:     Effort: Pulmonary effort is normal.     Breath sounds: Normal breath sounds. No wheezing, rhonchi or rales.  Abdominal:     General: Abdomen is flat.     Palpations: Abdomen is soft.     Tenderness: There is no abdominal tenderness. There is no right CVA tenderness or left CVA tenderness.  Skin:    General: Skin is warm and dry.     Capillary Refill: Capillary refill takes less than 2 seconds.     Findings: No erythema or rash.  Neurological:     General: No focal deficit present.     Mental Status: She is alert and oriented to person, place, and time.  Psychiatric:        Mood and Affect: Mood normal.        Behavior: Behavior normal.  Thought Content: Thought content normal.        Judgment: Judgment normal.      UC Treatments / Results  Labs (all labs ordered are listed, but only abnormal  results are displayed) Labs Reviewed  WET PREP, GENITAL - Abnormal; Notable for the following components:      Result Value   WBC, Wet Prep HPF POC <10 (*)    All other components within normal limits  URINALYSIS, ROUTINE W REFLEX MICROSCOPIC - Abnormal; Notable for the following components:   Leukocytes,Ua TRACE (*)    All other components within normal limits  URINALYSIS, MICROSCOPIC (REFLEX) - Abnormal; Notable for the following components:   Bacteria, UA RARE (*)    All other components within normal limits  URINE CULTURE    EKG   Radiology No results found.  Procedures Procedures (including critical care time)  Medications Ordered in UC Medications - No data to display  Initial Impression / Assessment and Plan / UC Course  I have reviewed the triage vital signs and the nursing notes.  Pertinent labs & imaging results that were available during my care of the patient were reviewed by me and considered in my medical decision making (see chart for details).   Patient is a very pleasant, nontoxic-appearing 31 old female here for evaluation of urinary complaints as outlined in HPI above.  She is not in any acute distress and she has no CVA tenderness on exam.  She does endorse some mild suprapubic tenderness but does not demonstrate any guarding or rebound on physical exam.  Her urinalysis shows trace leukocyte esterase but is negative for nitrites or protein.  Her specific gravity is normal at 1.010.  Reflex micro shows rare bacteria but no WBCs.  A wet prep was then ordered which does not show any yeast, trichomonas, or clue cells.  I asked the patient when she had last urinated prior to give [place] a sample and she states that it was probably less than 30 minutes prior.  I am going to send the urine for culture and see if any bacteria grows out and will treat her symptoms with Pyridium.  Is entirely possible that she did not have enough bacteria in the urine to treat the color metric  screening but something will grow out on the culture.  I have also discussed with the patient that she needs to increase oral fluid intake so that she increases urine production and flushes her urinary tract.  Return precautions reviewed.   Final Clinical Impressions(s) / UC Diagnoses   Final diagnoses:  Dysuria     Discharge Instructions      Take the pyridium every 8 hours as needed for urinary discomfort.  Increase your oral fluid intake to increase urine production and flush your urinary tract.  We will culture your urine and treat any infection that grows out.  Return for new or worsening symptoms.      ED Prescriptions     Medication Sig Dispense Auth. Provider   phenazopyridine (PYRIDIUM) 200 MG tablet  (Status: Discontinued) Take 1 tablet (200 mg total) by mouth 3 (three) times daily. 6 tablet Becky Augusta, NP   phenazopyridine (PYRIDIUM) 200 MG tablet Take 1 tablet (200 mg total) by mouth 3 (three) times daily. 6 tablet Becky Augusta, NP      PDMP not reviewed this encounter.   Becky Augusta, NP 02/11/22 2015

## 2022-02-11 NOTE — Discharge Instructions (Addendum)
Take the pyridium every 8 hours as needed for urinary discomfort.  Increase your oral fluid intake to increase urine production and flush your urinary tract.  We will culture your urine and treat any infection that grows out.  Return for new or worsening symptoms.

## 2022-02-11 NOTE — ED Triage Notes (Signed)
Pt c/o urinary frequency,burning,pain x3 days. Denies any hematuria,abd pain or lower back pain.

## 2022-02-13 LAB — URINE CULTURE: Culture: NO GROWTH

## 2022-09-19 ENCOUNTER — Ambulatory Visit
Admission: EM | Admit: 2022-09-19 | Discharge: 2022-09-19 | Disposition: A | Discharge status: Home / Self Care | Payer: Federal, State, Local not specified - PPO | Attending: Family Medicine | Admitting: Family Medicine

## 2022-09-19 DIAGNOSIS — R0789 Other chest pain: Secondary | ICD-10-CM | POA: Diagnosis not present

## 2022-09-19 MED ORDER — METHOCARBAMOL 500 MG PO TABS: 500.0000 mg | ORAL_TABLET | Freq: Two times a day (BID) | ORAL | 0 refills | Status: AC

## 2022-09-19 MED ORDER — NAPROXEN 500 MG PO TABS: 500.0000 mg | ORAL_TABLET | Freq: Two times a day (BID) | ORAL | 0 refills | Status: AC

## 2022-09-19 NOTE — ED Triage Notes (Addendum)
Pt c/o RT sided chest pain onset today on the way home from work, pt also c/o cough onset earlier after work arpund 4pm. Pt also c/o mid RT back pain. 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

## 2022-09-19 NOTE — Discharge Instructions (Addendum)
Your symptoms may be related to lifting heavy objects at your desk.  You elected not to go to the emergency department today.    Go to ED for red flag symptoms, including; worsening chest pain that is accompanied by nausea, vomiting, sweating, pain in your neck or jaw. Severe headaches, vision changes, numbness/weakness in part of the body, lethargy, confusion, terrible vomiting, severe dehydration, breathing difficulty, severe persistent abdominal, feeling faint or passing out, dizziness, etc. You should especially go to the ED for sudden acute worsening of condition if you do not elect to go at this time.

## 2022-09-19 NOTE — ED Provider Notes (Signed)
[facility]    CSN: [id number] Arrival date & time: 09/19/22  1937      History   Chief Complaint Chief Complaint  Patient presents with   Chest Pain   Back Pain    HPI SHYLIN VENEZIA is a 67 y.o. female.   HPI  History provided by patient and her husband   Kayja here for chest pain that radiates to her right arm pit and upper back. Pain gets worse with coughing.  Pain started around 430 PM after getting off work. Has had non-productive cough.  Takes her Lipitor  and omeprazole daily.  She hasn't been taking her baby aspirin as she ran out. 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 Denies shortness of breath. She is a former tobacco smoker.    Patient Denies: Nausea, Vomiting, Diaphoresis, Leg swelling, Syncope, Heartburn/food sticking, Recent immobility, Wheezing, Trauma, and Fever  Pt reports no history of PE, DVT, DM, HTN, cancer, recent surgery or recent travel    Past Medical History:  Diagnosis Date   Stroke (HCC)    3 years ago (mini stroke)    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 Authorizing Provider  methocarbamol (ROBAXIN) 500 MG tablet Take 1 tablet (500 mg total) by mouth 2 (two) times daily. 09/19/22  Yes Lainie Daubert, DO  naproxen (NAPROSYN) 500 MG tablet Take 1 tablet (500 mg total) by mouth 2 (two) times daily with a meal. 09/19/22  Yes Halima Fogal, DO  aspirin 325 MG tablet Take 325 mg by mouth daily.    [provider]  atorvastatin (LIPITOR) 10 MG tablet  12/01/20   [provider]  b complex vitamins tablet Take 1 tablet by mouth daily.    [provider]  omeprazole (PRILOSEC) 40 MG capsule  10/30/20   [provider]  phenazopyridine (PYRIDIUM) 200 MG tablet Take 1 tablet (200 mg total) by mouth 3 (three) times daily. 02/11/22   Becky Augusta, NP  VITAMIN A PO Take 1 tablet by mouth daily.    [provider]  vitamin C (ASCORBIC ACID) 250 MG tablet Take 250 mg by mouth daily.    [provider]  vitamin E 1000 UNIT capsule Take 1 capsule by  mouth daily.    [provider]    Family History Family History  Problem Relation Age of Onset   Hyperlipidemia Mother    Heart attack Father     Social History Social History   Tobacco Use   Smoking status: Former    Current packs/day: 0.00    Average packs/day: 1 pack/day for 10.0 years (10.0 ttl pk-yrs)    Types: Cigarettes    Start date: 02/03/1974    Quit date: 02/04/1984    Years since quitting: 38.6   Smokeless tobacco: Never  Vaping Use   Vaping status: Never Used  Substance Use Topics   Alcohol use: No   Drug use: No     Allergies   Molds & smuts   Review of Systems Review of Systems :negative unless otherwise stated in HPI.      Physical Exam Triage Vital Signs ED Triage Vitals  Encounter Vitals Group     BP 09/19/22 1943 (!) 147/90     Systolic BP Percentile --      Diastolic BP Percentile --       Pulse Rate 09/19/22 1943 85     Resp --      Temp 09/19/22 1943 98.4 F (36.9 C)     Temp Source 09/19/22 1943 Oral     SpO2 09/19/22 1943 97 %     Weight --      Height --      Head Circumference --      Peak Flow --      Pain Score 09/19/22 1942 5     Pain Loc --      Pain Education --      Exclude from Growth Chart --    No data found.  Updated Vital Signs BP (!) 147/90 (BP Location: Left Arm)   Pulse 85   Temp 98.4 F (36.9 C) (Oral)   LMP 02/03/2013   SpO2 97%   Visual Acuity Right Eye Distance:   Left Eye Distance:   Bilateral Distance:    Right Eye Near:   Left Eye Near:    Bilateral Near:     Physical Exam  GEN: non-ill appearing elderly female, in no acute distress  CV: regular rate and rhythm,  no murmurs, rubs or gallops  appreciated, no JVP  CHEST WALL: multiple areas that are TTP on right chest wall, no deformity   RESP: no increased work of breathing, clear to ascultation bilaterally ABD: Bowel sounds present. Soft, non-tender, non-distended.  MSK: no edema SKIN: warm, dry, no rash on visible skin, brisk cap refill  NEURO: alert, moves all extremities appropriately   UC Treatments / Results  Labs (all labs ordered are listed, but only abnormal results are displayed) Labs Reviewed - No data to display  EKG  See my EKG interpretation in the MDM section  Radiology No results found.   Procedures Procedures (including critical care time)  Medications Ordered in UC Medications - No data to display  Initial Impression / Assessment and Plan / UC Course  I have reviewed the triage vital signs and the nursing notes.  Pertinent labs & imaging results that were available during my care of the patient were reviewed by me and considered in my medical decision making (see chart for details).       Patient is a 67 y.o. female with history of HLD, GERD, TIA who presents with acute right sided chest pain that radiates to right upper back.  Differential diagnosis includes, but is not limited to, ACS, aortic dissection, pulmonary embolism, cardiac tamponade, pneumothorax, pneumonia, pericarditis/myocarditis, GI-related causes including biliary colic, cholecystitis, esophagitis/gastritis, and musculoskeletal chest wall pain. Last seen by cardiology, per chart review, in Dec 2023. She had a normal stress test in Dec 2022. She follows with [facility]. Has had normal troponins in the ED in the past thought she had an elevated D-dimer but negative CTA Chest.  Magenta has no history of PE. Not able to [facility] pt out due to age but not likely PE.  EKG obtained and is without ST elevations or concern for ischemia, NSR.  I do not think his chest pain is cardiopulmonary related. No GERD symptoms other than cough. Not likely illicit drug induced. No other anxiety signs or symptoms. Exam not concerning for costochondritis however pain is reproducible, on exam.  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 Considered ED for cardiac evaluation but husband and pt agree this is likely not cardiac in nature.  Treat muscular strain likely 2/2 to work related duties with Robaxin and Naprosyn as below.  Strict ED precautions given and patient and her husband voiced understanding.      Final Clinical Impressions(s) / UC Diagnoses   Final diagnoses:  Chest wall pain     Discharge Instructions      Your symptoms may be related to lifting heavy objects at your desk.  You elected not to go to the emergency department today.    Go to ED for red flag symptoms, including; worsening chest pain that is accompanied by nausea, vomiting, sweating, pain in your neck or jaw. Severe headaches, vision changes, numbness/weakness in part of the body, lethargy, confusion, terrible vomiting, severe dehydration, breathing difficulty, severe persistent abdominal, feeling faint or passing out, dizziness, etc. You should especially go to the ED for sudden acute worsening of  condition if you do not elect to go at this time.       ED Prescriptions     Medication Sig Dispense Auth. Provider   methocarbamol (ROBAXIN) 500 MG tablet Take 1 tablet (500 mg total) by mouth 2 (two) times daily. 20 tablet Jalina Blowers, DO   naproxen (NAPROSYN) 500 MG tablet Take 1 tablet (500 mg total) by mouth 2 (two) times daily with a meal. 30 tablet Kenadi Miltner, DO      PDMP not reviewed this encounter.   Katha Cabal, DO 09/22/22 0320

## 2022-11-20 ENCOUNTER — Ambulatory Visit
Admission: EM | Admit: 2022-11-20 | Discharge: 2022-11-20 | Disposition: A | Discharge status: Home / Self Care | Payer: Federal, State, Local not specified - PPO | Attending: Family Medicine | Admitting: Family Medicine

## 2022-11-20 DIAGNOSIS — Z1152 Encounter for screening for COVID-19: Secondary | ICD-10-CM | POA: Insufficient documentation

## 2022-11-20 DIAGNOSIS — R059 Cough, unspecified: Secondary | ICD-10-CM | POA: Insufficient documentation

## 2022-11-20 DIAGNOSIS — J069 Acute upper respiratory infection, unspecified: Secondary | ICD-10-CM | POA: Diagnosis not present

## 2022-11-20 DIAGNOSIS — B9789 Other viral agents as the cause of diseases classified elsewhere: Secondary | ICD-10-CM | POA: Insufficient documentation

## 2022-11-20 LAB — GROUP A STREP BY PCR: Group A Strep by PCR: NOT DETECTED

## 2022-11-20 LAB — RESP PANEL BY RT-PCR (RSV, FLU A&B, COVID)  RVPGX2
Influenza A by PCR: NEGATIVE
Influenza B by PCR: NEGATIVE
Resp Syncytial Virus by PCR: NEGATIVE
SARS Coronavirus 2 by RT PCR: NEGATIVE

## 2022-11-20 MED ORDER — PROMETHAZINE-DM 6.25-15 MG/5ML PO SYRP: 5.0000 mL | ORAL_SOLUTION | Freq: Four times a day (QID) | ORAL | 0 refills | Status: AC | PRN

## 2022-11-20 NOTE — ED Provider Notes (Signed)
[facility]    CSN: [id number] Arrival date & time: 11/20/22  1114      History   Chief Complaint Chief Complaint  Patient presents with   Cough   nasal drainage   Fever    HPI Kathy Kennedy is a 67 y.o. female.   HPI  History obtained from the patient. Kathy Kennedy presents for cold symptoms that started on Saturday. Has cough, rhinorrhea and post-nasal drip.   Feels like it should be getting better but is getting new symptoms.  Has diarrhea and decreased appetite.  No nausea, vomiting,   Felt warm but didn't take her temperature. Has had chills. Took high dose Vit C without relief.  No cough or cold symptoms controlling.    Fever : undocumented Chills: yes Sore throat: resolved   Cough: yes  Sputum: no Chest tightness: yes  Shortness of breath: yes  Wheezing: no Nasal congestion : yes Rhinorrhea:yes  Myalgias: no Appetite: normal  Hydration: normal  Abdominal pain: no Nausea: no Vomiting: no Diarrhea: yes Rash: No Sleep disturbance: no Headache: resolved     Past Medical History:  Diagnosis Date   Stroke (HCC)    3 years ago (mini stroke)    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 Authorizing Provider  aspirin 325 MG tablet Take 325 mg by mouth daily.   Yes [provider]  atorvastatin (LIPITOR) 10 MG tablet  12/01/20  Yes [provider]  b complex vitamins tablet Take 1 tablet by mouth daily.   Yes [provider]  methocarbamol (ROBAXIN) 500 MG tablet Take 1 tablet (500 mg total) by mouth 2 (two) times daily. 09/19/22  Yes Paylin Hailu, DO  naproxen (NAPROSYN) 500 MG tablet Take 1 tablet (500 mg total) by mouth 2 (two) times daily with a meal. 09/19/22  Yes Marissa Weaver, DO  omeprazole (PRILOSEC) 40 MG capsule  10/30/20  Yes [provider]  phenazopyridine (PYRIDIUM) 200 MG tablet Take 1 tablet (200 mg total) by mouth 3 (three) times daily. 02/11/22  Yes Becky Augusta, NP  promethazine-dextromethorphan (PROMETHAZINE-DM) 6.25-15 MG/5ML syrup Take 5 mLs by mouth 4 (four) times daily as needed. 11/20/22  Yes Jhon Mallozzi, DO  VITAMIN A PO Take 1 tablet  by mouth daily.   Yes [provider]  vitamin C (ASCORBIC ACID) 250 MG tablet Take 250 mg by mouth daily.   Yes [provider]  vitamin E 1000 UNIT capsule Take 1 capsule by mouth daily.   Yes [provider]    Family History Family History  Problem Relation Age of Onset   Hyperlipidemia Mother    Heart attack Father     Social History Social History   Tobacco Use   Smoking status: Former    Current packs/day: 0.00    Average packs/day: 1 pack/day for 10.0 years (10.0 ttl pk-yrs)    Types: Cigarettes    Start date: 02/03/1974    Quit date: 02/04/1984    Years since quitting: 38.8   Smokeless tobacco: Never  Vaping Use   Vaping status: Never Used  Substance Use Topics   Alcohol use: No   Drug use: No     Allergies   Molds &  smuts   Review of Systems Review of Systems: negative unless otherwise stated in HPI.      Physical Exam Triage Vital Signs ED Triage Vitals  Encounter Vitals Group     BP 11/20/22 1209 (!) 151/84     Systolic BP Percentile --      Diastolic BP Percentile --      Pulse Rate 11/20/22 1209 70     Resp 11/20/22 1209 18     Temp 11/20/22 1209 97.7 F (36.5 C)     Temp src --      SpO2 11/20/22 1209 100 %     Weight --      Height --      Head Circumference --      Peak Flow --      Pain Score 11/20/22 1207 0     Pain Loc --      Pain Education --      Exclude from Growth Chart --    No data found.  Updated Vital Signs BP (!) 151/84   Pulse 70   Temp 97.7 F (36.5 C)   Resp 18   LMP 02/03/2013   SpO2 100%   Visual Acuity Right Eye Distance:   Left Eye Distance:   Bilateral Distance:    Right Eye Near:   Left Eye Near:    Bilateral Near:     Physical Exam GEN:     alert, non-toxic appearing elderly female in no distress    HENT:  mucus membranes moist, erythematous lesions on the palate, no tonsillar hypertrophy or exudates, no nasal discharge EYES:   pupils equal and reactive, no scleral injection or discharge NECK:  normal ROM, no meningismus   RESP:  no increased work of breathing, clear to auscultation bilaterally CVS:   regular rate and rhythm Skin:   warm and dry, no rash on visible skin    UC Treatments / Results  Labs (all labs ordered are listed, but only abnormal results are displayed) Labs Reviewed  RESP PANEL BY RT-PCR (RSV, FLU A&B, COVID)  [id number]  GROUP A STREP BY PCR    EKG   Radiology No results found.  Procedures Procedures (including critical care time)  Medications Ordered in UC Medications - No data to display  Initial Impression / Assessment and Plan / UC Course  I have reviewed the triage vital signs and the nursing notes.  Pertinent labs & imaging results that were available during my care of the patient were  reviewed  by me and considered in my medical decision making (see chart for details).       Pt is a 67 y.o. female who presents for 4 days of respiratory symptoms. Kathy Kennedy is afebrile here without recent antipyretics. Satting well on room air. Overall pt is non-toxic appearing, well hydrated, without respiratory distress. Pulmonary exam is unremarkable.  COVID, ifnluenza and RSV testing obtained and was negative. Given the palatal lesion obtained a strep PCR which was negative. Doubt pneumonia at this time.    History consistent with viral respiratory illness. Discussed symptomatic treatment.  Explained lack of efficacy of antibiotics in viral disease.  Typical duration of symptoms discussed.  Promethazine DM for cough.  Return and ED precautions given and voiced understanding. Discussed MDM, treatment plan and plan for follow-up with patient who agrees with plan.     Final Clinical Impressions(s) / UC Diagnoses   Final diagnoses:  Viral URI with cough     Discharge Instructions      Your strep, COVID, influenza and RSV test were all negative.  I suspect that you have a viral illness that will go away with time.  Symptoms last more than 2 weeks return to the urgent care.   [facility] to [facility] up your cough syrup.  This medication may make you sleepy so do not take when you have to be alert, operating heavy machinery or driving.   You can take Tylenol and/or Ibuprofen as needed for fever reduction and pain relief.    For cough: honey 1/2 to 1 teaspoon (you can dilute the honey in water or another fluid).  You can also use guaifenesin and dextromethorphan for cough. You can use a humidifier for chest congestion and cough.  If you don't have a humidifier, you can sit in the bathroom with the hot shower running.      For sore throat: try warm salt water gargles, Mucinex sore throat cough drops or cepacol lozenges, throat spray, warm tea or water with lemon/honey, popsicles or ice, or  OTC cold relief medicine for throat discomfort. You can also purchase chloraseptic spray at the pharmacy or dollar store.   For congestion: take a daily anti-histamine like Zyrtec, Claritin, and a oral decongestant, such as pseudoephedrine.  You can also use Flonase 1-2 sprays in each nostril daily. Afrin is also a good option, if you do not have high blood pressure.    It is important to stay hydrated: drink plenty of fluids (water, gatorade/powerade/pedialyte, juices, or teas) to keep your throat moisturized and help further relieve irritation/discomfort.    Return or go to the Emergency Department if symptoms worsen or do not improve in the next few days      ED Prescriptions     Medication Sig Dispense Auth. Provider   promethazine-dextromethorphan (PROMETHAZINE-DM) 6.25-15 MG/5ML syrup Take 5 mLs by mouth 4 (four) times daily as needed. 118 mL Katha Cabal, DO      PDMP not reviewed this encounter.   Katha Cabal, DO 11/20/22 1435

## 2022-11-20 NOTE — Discharge Instructions (Addendum)
Your strep, COVID, influenza and RSV test were all negative.  I suspect that you have a viral illness that will go away with time.  Symptoms last more than 2 weeks return to the urgent care.   [facility] to [facility] up your cough syrup.  This medication may make you sleepy so do not take when you have to be alert, operating heavy machinery or driving.   You can take Tylenol and/or Ibuprofen as needed for fever reduction and pain relief.    For cough: honey 1/2 to 1 teaspoon (you can dilute the honey in water or another fluid).  You can also use guaifenesin and dextromethorphan for cough. You can use a humidifier for chest congestion and cough.  If you don't have a humidifier, you can sit in the bathroom with the hot shower running.      For sore throat: try warm salt water gargles, Mucinex sore throat cough drops or cepacol lozenges, throat spray, warm tea or water with lemon/honey, popsicles or ice, or OTC cold relief medicine for throat discomfort. You can also purchase chloraseptic spray at the pharmacy or dollar store.   For congestion: take a daily anti-histamine like Zyrtec, Claritin, and a oral decongestant, such as pseudoephedrine.  You can also use Flonase 1-2 sprays in each nostril daily. Afrin is also a good option, if you do not have high blood pressure.    It is important to stay hydrated: drink plenty of fluids (water, gatorade/powerade/pedialyte, juices, or teas) to keep your throat moisturized and help further relieve irritation/discomfort.    Return or go to the Emergency Department if symptoms worsen or do not improve in the next few days

## 2022-11-20 NOTE — ED Triage Notes (Signed)
Cough congestion, fever and nasal drainage since Saturday

## 2023-01-18 ENCOUNTER — Emergency Department: Payer: Federal, State, Local not specified - PPO

## 2023-01-18 ENCOUNTER — Other Ambulatory Visit: Payer: Self-pay

## 2023-01-18 ENCOUNTER — Emergency Department
Admission: EM | Admit: 2023-01-18 | Discharge: 2023-01-18 | Disposition: A | Discharge status: Home / Self Care | Payer: Federal, State, Local not specified - PPO | Attending: Student in an Organized Health Care Education/Training Program | Admitting: Student in an Organized Health Care Education/Training Program

## 2023-01-18 DIAGNOSIS — R112 Nausea with vomiting, unspecified: Secondary | ICD-10-CM | POA: Diagnosis present

## 2023-01-18 DIAGNOSIS — D72829 Elevated white blood cell count, unspecified: Secondary | ICD-10-CM | POA: Diagnosis not present

## 2023-01-18 DIAGNOSIS — R197 Diarrhea, unspecified: Secondary | ICD-10-CM

## 2023-01-18 DIAGNOSIS — K5730 Diverticulosis of large intestine without perforation or abscess without bleeding: Secondary | ICD-10-CM | POA: Diagnosis not present

## 2023-01-18 DIAGNOSIS — Z8673 Personal history of transient ischemic attack (TIA), and cerebral infarction without residual deficits: Secondary | ICD-10-CM | POA: Diagnosis not present

## 2023-01-18 DIAGNOSIS — K5790 Diverticulosis of intestine, part unspecified, without perforation or abscess without bleeding: Secondary | ICD-10-CM

## 2023-01-18 LAB — URINALYSIS, ROUTINE W REFLEX MICROSCOPIC
Bacteria, UA: NONE SEEN
Bilirubin Urine: NEGATIVE
Glucose, UA: NEGATIVE mg/dL
Hgb urine dipstick: NEGATIVE
Ketones, ur: 5 mg/dL — AB
Nitrite: NEGATIVE
Protein, ur: NEGATIVE mg/dL
Specific Gravity, Urine: 1.023 (ref 1.005–1.030)
pH: 5 (ref 5.0–8.0)

## 2023-01-18 LAB — COMPREHENSIVE METABOLIC PANEL
ALT: 27 U/L (ref 0–44)
AST: 35 U/L (ref 15–41)
Albumin: 4.1 g/dL (ref 3.5–5.0)
Alkaline Phosphatase: 60 U/L (ref 38–126)
Anion gap: 12 (ref 5–15)
BUN: 19 mg/dL (ref 8–23)
CO2: 22 mmol/L (ref 22–32)
Calcium: 8.9 mg/dL (ref 8.9–10.3)
Chloride: 103 mmol/L (ref 98–111)
Creatinine, Ser: 0.76 mg/dL (ref 0.44–1.00)
GFR, Estimated: >60 mL/min (ref >60)
Glucose, Bld: 130 mg/dL — ABNORMAL HIGH (ref 70–99)
Potassium: 4 mmol/L (ref 3.5–5.1)
Sodium: 137 mmol/L (ref 135–145)
Total Bilirubin: 0.4 mg/dL (ref <1.2)
Total Protein: 7 g/dL (ref 6.5–8.1)

## 2023-01-18 LAB — CBC
HCT: 40.7 % (ref 36.0–46.0)
Hemoglobin: 13.6 g/dL (ref 12.0–15.0)
MCH: 30.2 pg (ref 26.0–34.0)
MCHC: 33.4 g/dL (ref 30.0–36.0)
MCV: 90.4 fL (ref 80.0–100.0)
Platelets: 254 10*3/uL (ref 150–400)
RBC: 4.5 MIL/uL (ref 3.87–5.11)
RDW: 13.1 % (ref 11.5–15.5)
WBC: 11.7 10*3/uL — ABNORMAL HIGH (ref 4.0–10.5)
nRBC: 0 % (ref 0.0–0.2)

## 2023-01-18 LAB — LIPASE, BLOOD: Lipase: 41 U/L (ref 11–51)

## 2023-01-18 MED ORDER — IOHEXOL 300 MG/ML  SOLN
80.0000 mL | Freq: Once | INTRAMUSCULAR | Status: AC | PRN
  Administered 2023-01-18: 80 mL via INTRAVENOUS

## 2023-01-18 NOTE — ED Provider Notes (Signed)
[facility] Provider Note    Event Date/Time   First MD Initiated Contact with Patient 01/18/23 0801     (approximate)   History   No chief complaint on file.   HPI  Kathy Kennedy is a 67 y.o. female with a past medical history of TIA, hypercholesterolemia, fibromyalgia, hyperlipidemia, anxiety who presents today for evaluation of left lower quadrant pain.  Patient reports that her symptoms began overnight.  She thought that she was constipated yesterday and took a laxative and had a normal bowel movement today, followed by diarrhea.  There is no blood in her stool.  She reports that she has cramping abdominal pain.  She developed nausea this morning and had 1 episode of vomiting since arriving to the emergency department.  No fevers or chills.  No burning with urination.  No history of abdominal surgery.  She reports that she currently does not have any pain.  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   Tendinitis of wrist 05/26/2014          Physical Exam   Triage Vital Signs: ED Triage Vitals  Encounter Vitals Group     BP 01/18/23 0615 (!) 152/91     Systolic BP Percentile --      Diastolic BP Percentile --      Pulse Rate 01/18/23 0615 70     Resp 01/18/23 0615 18     Temp 01/18/23 0615 97.6 F (36.4 C)     Temp Source 01/18/23 0615 Oral     SpO2 01/18/23 0615 100 %      Weight 01/18/23 0610 130 lb (59 kg)     Height 01/18/23 0610 5\' 4"  (1.626 m)     Head Circumference --      Peak Flow --      Pain Score 01/18/23 0610 8     Pain Loc --      Pain Education --      Exclude from Growth Chart --     Most recent vital signs: Vitals:   01/18/23 0615  BP: (!) 152/91  Pulse: 70  Resp: 18  Temp: 97.6 F (36.4 C)  SpO2: 100%    Physical Exam Vitals and nursing note reviewed.  Constitutional:      General: Awake and alert. No acute distress.    Appearance: Normal appearance. The patient is normal weight.  HENT:     Head: Normocephalic and atraumatic.     Mouth: Mucous membranes are moist.  Eyes:     General: PERRL. Normal EOMs        Right eye: No discharge.        Left eye: No discharge.     Conjunctiva/sclera: Conjunctivae normal.  Cardiovascular:     Rate and Rhythm: Normal rate and regular rhythm.     Pulses: Normal pulses.  Pulmonary:     Effort: Pulmonary effort is normal. No respiratory distress.  Breath sounds: Normal breath sounds.  Abdominal:     Abdomen is soft. There is no abdominal tenderness. No rebound or guarding. No distention. Musculoskeletal:        General: No swelling. Normal range of motion.     Cervical back: Normal range of motion and neck supple.  Skin:    General: Skin is warm and dry.     Capillary Refill: Capillary refill takes less than 2 seconds.     Findings: No rash.  Neurological:     Mental Status: The patient is awake and alert.      ED Results / Procedures / Treatments   Labs (all labs ordered are listed, but only abnormal results are displayed) Labs Reviewed  COMPREHENSIVE METABOLIC PANEL - Abnormal; Notable for the following components:      Result Value   Glucose, Bld 130 (*)    All other components within normal limits  CBC - Abnormal; Notable for the following components:   WBC 11.7 (*)    All other components within normal limits  URINALYSIS, ROUTINE W REFLEX MICROSCOPIC - Abnormal;  Notable for the following components:   Color, Urine AMBER (*)    APPearance HAZY (*)    Ketones, ur 5 (*)    Leukocytes,Ua TRACE (*)    All other components within normal limits  LIPASE, BLOOD     EKG     RADIOLOGY I independently reviewed and interpreted imaging and agree with radiologists findings.     PROCEDURES:  Critical Care performed:   Procedures   MEDICATIONS ORDERED IN ED: Medications  iohexol (OMNIPAQUE) 300 MG/ML solution 80 mL (80 mLs Intravenous Contrast Given 01/18/23 0834)     IMPRESSION / MDM / ASSESSMENT AND PLAN / ED COURSE  I reviewed the triage vital signs and the nursing notes.   Differential diagnosis includes, but is not limited to, diverticulitis, gastroenteritis, appendicitis, urinary tract infection.  Patient is awake and alert, hemodynamically stable and afebrile.  She is currently resting comfortably on the stretcher.  She has no reproducible abdominal tenderness on exam, but reports that her pain comes and goes.  Labs obtained in triage reveal a leukocytosis to 11.7.  Given possible suspicion for diverticulitis, patient agreed to CT abdomen pelvis.  She declines antiemetic or analgesia at the time of initial evaluation.  CT scan reveals diverticulosis without diverticulitis.  No other acute findings noted.  No evidence of constipation.  Patient was reevaluated and continues to be asymptomatic and reports that she feels significantly improved.  It is possible that she had gastroenteritis with the nausea, vomiting, and diarrhea that she reported.  However her stools nonbloody, she is tolerating p.o., and her pain is well-controlled.  We discussed return precautions the importance of close outpatient follow-up.  We also discussed return precautions.  Patient understands and agrees with plan.  She was discharged in stable condition.   Patient's presentation is most consistent with acute complicated illness / injury requiring diagnostic  workup.   Clinical Course as of 01/18/23 1055  Sat Jan 18, 2023  2130 On reevaluation, patient reports that she continues to feel well.  Continues to be asymptomatic.  She feels ready for discharge home [JP]    Clinical Course User Index [JP] Savion Washam, Herb Grays, PA-C     FINAL CLINICAL IMPRESSION(S) / ED DIAGNOSES   Final diagnoses:  Nausea vomiting and diarrhea  Diverticulosis     Rx / DC Orders   ED Discharge Orders     None  Note:  This document was prepared using Dragon voice recognition software and may include unintentional dictation errors.   Keturah Shavers 01/18/23 1055    Willy Eddy, MD 01/18/23 1153

## 2023-01-18 NOTE — ED Triage Notes (Signed)
BIB [facility] from home. Reports abd pain since this AM. Constipated yesterday. States bm this morning but continues to have pain and cramping. Recent hx of same. Took miralax last night. Alert and oriented. Breathing unlabored.

## 2023-01-18 NOTE — Discharge Instructions (Signed)
You were seen in the Emergency Department for your abdominal pain, nausea, vomiting, and diarrhea. We checked lab work including your blood counts and general chemistries and these were all normal today without any remarkable abnormalities.  Your CT did not show any acute findings.  It is most likely that your symptoms today are caused by a viral infection and should improve over the next few days.  It is important that you maintain a gentle diet with plenty of clear fluids.  You can use the medication we provided you for symptomatic relief of the nausea.  You should stay well hydrated. -- Rest, Drink lots of fluids. Advance diet as tolerated, if your have worsening abdominal pain, if you are unable to keep fluids down, should you develop fever greater than 100.4, return to the Emergency Department. If you have any concerns at all, please return to the Emergency Department. -- For the next 48 hours maintain hydration with at least 8 glasses of fluid per day of clear juices- apple or cranberry, broth, jell-O, popsicles, sherbert, tea, ginger ale and dry toast and crackers. -- After 48 hours Progress your diet to soft BRAT Diet: rice, bananas, applesauce and toast: then mashed potatoes and vegetables, -- Advance as tolerated, vegetables, eggs, chicken, fish, fruit -- Refrain from dairy products, fatty greasy, spicy food for one week If you have increasing abdominal pain, bright red blood from rectum, uncontrollable nausea and vomiting, fever > 100.62F, chills, inability to hold down liquids, decrease in urination, or any symptoms

## 2023-01-18 NOTE — ED Notes (Signed)
PT went to the bathroom during the middle of obtaining VS and vomited, states color of vomit is brown.
